# Patient Record
Sex: Male | Born: 1990 | Race: Black or African American | Hispanic: No | Marital: Married | Smoking: Current every day smoker
Health system: Southern US, Community
[De-identification: ages and names within clinical notes are randomized; demographics above are authoritative.]

## PROBLEM LIST (undated history)

## (undated) ENCOUNTER — Emergency Department (HOSPITAL_COMMUNITY): Admission: EM | Payer: Medicaid Other | Source: Home / Self Care

## (undated) HISTORY — PX: HAND SURGERY: SHX662

---
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
## 2008-07-11 ENCOUNTER — Emergency Department (HOSPITAL_COMMUNITY): Admission: EM | Admit: 2008-07-11 | Discharge: 2008-07-11 | Payer: Self-pay | Admitting: Emergency Medicine

## 2009-01-28 ENCOUNTER — Emergency Department (HOSPITAL_COMMUNITY): Admission: EM | Admit: 2009-01-28 | Discharge: 2009-01-28 | Payer: Self-pay | Admitting: Emergency Medicine

## 2009-06-04 ENCOUNTER — Emergency Department (HOSPITAL_COMMUNITY): Admission: EM | Admit: 2009-06-04 | Discharge: 2009-06-05 | Payer: Self-pay | Admitting: Emergency Medicine

## 2009-06-08 ENCOUNTER — Ambulatory Visit (HOSPITAL_COMMUNITY): Admission: RE | Admit: 2009-06-08 | Discharge: 2009-06-08 | Payer: Self-pay | Admitting: Orthopedic Surgery

## 2009-06-17 ENCOUNTER — Encounter: Admission: RE | Admit: 2009-06-17 | Discharge: 2009-07-29 | Payer: Self-pay | Admitting: Orthopedic Surgery

## 2009-11-26 ENCOUNTER — Emergency Department (HOSPITAL_COMMUNITY): Admission: EM | Admit: 2009-11-26 | Discharge: 2009-11-26 | Payer: Self-pay | Admitting: Emergency Medicine

## 2009-11-30 ENCOUNTER — Inpatient Hospital Stay (HOSPITAL_COMMUNITY): Admission: EM | Admit: 2009-11-30 | Discharge: 2009-12-08 | Payer: Self-pay | Admitting: Emergency Medicine

## 2009-11-30 ENCOUNTER — Ambulatory Visit: Payer: Self-pay | Admitting: Infectious Diseases

## 2009-12-01 ENCOUNTER — Encounter (INDEPENDENT_AMBULATORY_CARE_PROVIDER_SITE_OTHER): Payer: Self-pay | Admitting: Internal Medicine

## 2010-03-15 ENCOUNTER — Emergency Department (HOSPITAL_COMMUNITY)
Admission: EM | Admit: 2010-03-15 | Discharge: 2010-03-15 | Payer: Self-pay | Source: Home / Self Care | Admitting: Emergency Medicine

## 2010-06-01 LAB — CULTURE, BLOOD (ROUTINE X 2)
Culture  Setup Time: 201109170026
Culture  Setup Time: 201109182114
Culture  Setup Time: 201109141738
Culture  Setup Time: 201109141738
Culture: NO GROWTH
Culture: NO GROWTH
Culture: NO GROWTH

## 2010-06-01 LAB — URINE MICROSCOPIC-ADD ON

## 2010-06-01 LAB — BASIC METABOLIC PANEL
Chloride: 106 mEq/L (ref 96–112)
Creatinine, Ser: 1.11 mg/dL (ref 0.4–1.5)
GFR calc Af Amer: >60 mL/min (ref >60)
GFR calc non Af Amer: >60 mL/min (ref >60)
Glucose, Bld: 137 mg/dL — ABNORMAL HIGH (ref 70–99)
Potassium: 3.8 mEq/L (ref 3.5–5.1)
Potassium: 4.7 mEq/L (ref 3.5–5.1)
Sodium: 136 mEq/L (ref 135–145)
Sodium: 137 mEq/L (ref 135–145)

## 2010-06-01 LAB — CBC
HCT: 32.1 % — ABNORMAL LOW (ref 39.0–52.0)
HCT: 33.1 % — ABNORMAL LOW (ref 39.0–52.0)
HCT: 33.4 % — ABNORMAL LOW (ref 39.0–52.0)
HCT: 34.1 % — ABNORMAL LOW (ref 39.0–52.0)
HCT: 35.8 % — ABNORMAL LOW (ref 39.0–52.0)
HCT: 42.5 % (ref 39.0–52.0)
Hemoglobin: 10.9 g/dL — ABNORMAL LOW (ref 13.0–17.0)
Hemoglobin: 11.1 g/dL — ABNORMAL LOW (ref 13.0–17.0)
Hemoglobin: 11.4 g/dL — ABNORMAL LOW (ref 13.0–17.0)
Hemoglobin: 11.8 g/dL — ABNORMAL LOW (ref 13.0–17.0)
Hemoglobin: 12.5 g/dL — ABNORMAL LOW (ref 13.0–17.0)
MCH: 32.3 pg (ref 26.0–34.0)
MCHC: 34.9 g/dL (ref 30.0–36.0)
MCHC: 34.5 g/dL (ref 30.0–36.0)
MCV: 92.5 fL (ref 78.0–100.0)
MCV: 92.7 fL (ref 78.0–100.0)
MCV: 93.5 fL (ref 78.0–100.0)
MCV: 93.7 fL (ref 78.0–100.0)
MCV: 93.8 fL (ref 78.0–100.0)
Platelets: 364 10*3/uL (ref 150–400)
Platelets: 367 10*3/uL (ref 150–400)
Platelets: 455 10*3/uL — ABNORMAL HIGH (ref 150–400)
RBC: 3.44 MIL/uL — ABNORMAL LOW (ref 4.22–5.81)
RBC: 3.53 MIL/uL — ABNORMAL LOW (ref 4.22–5.81)
RBC: 3.61 MIL/uL — ABNORMAL LOW (ref 4.22–5.81)
RDW: 13.5 % (ref 11.5–15.5)
RDW: 13.3 % (ref 11.5–15.5)
WBC: 15.2 10*3/uL — ABNORMAL HIGH (ref 4.0–10.5)
WBC: 15.2 10*3/uL — ABNORMAL HIGH (ref 4.0–10.5)
WBC: 17.3 10*3/uL — ABNORMAL HIGH (ref 4.0–10.5)
WBC: 19.5 10*3/uL — ABNORMAL HIGH (ref 4.0–10.5)
WBC: 20.5 10*3/uL — ABNORMAL HIGH (ref 4.0–10.5)

## 2010-06-01 LAB — DIFFERENTIAL
Basophils Absolute: 0 10*3/uL (ref 0.0–0.1)
Basophils Absolute: 0 10*3/uL (ref 0.0–0.1)
Basophils Relative: 0 % (ref 0–1)
Basophils Relative: 0 % (ref 0–1)
Basophils Relative: 0 % (ref 0–1)
Eosinophils Absolute: 1 10*3/uL — ABNORMAL HIGH (ref 0.0–0.7)
Eosinophils Absolute: 1.4 10*3/uL — ABNORMAL HIGH (ref 0.0–0.7)
Eosinophils Absolute: 2.1 10*3/uL — ABNORMAL HIGH (ref 0.0–0.7)
Eosinophils Absolute: 1.3 10*3/uL — ABNORMAL HIGH (ref 0.0–0.7)
Eosinophils Relative: 10 % — ABNORMAL HIGH (ref 0–5)
Eosinophils Relative: 10 % — ABNORMAL HIGH (ref 0–5)
Lymphocytes Relative: 2 % — ABNORMAL LOW (ref 12–46)
Lymphocytes Relative: 4 % — ABNORMAL LOW (ref 12–46)
Lymphocytes Relative: 5 % — ABNORMAL LOW (ref 12–46)
Lymphocytes Relative: 5 % — ABNORMAL LOW (ref 12–46)
Lymphs Abs: 0.5 10*3/uL — ABNORMAL LOW (ref 0.7–4.0)
Lymphs Abs: 0.5 10*3/uL — ABNORMAL LOW (ref 0.7–4.0)
Lymphs Abs: 0.7 10*3/uL (ref 0.7–4.0)
Lymphs Abs: 0.7 10*3/uL (ref 0.7–4.0)
Monocytes Absolute: 0.2 10*3/uL (ref 0.1–1.0)
Monocytes Absolute: 0.8 10*3/uL (ref 0.1–1.0)
Monocytes Absolute: 0.9 10*3/uL (ref 0.1–1.0)
Monocytes Relative: 1 % — ABNORMAL LOW (ref 3–12)
Monocytes Relative: 3 % (ref 3–12)
Monocytes Relative: 5 % (ref 3–12)
Monocytes Relative: 6 % (ref 3–12)
Monocytes Relative: 8 % (ref 3–12)
Monocytes Relative: 8 % (ref 3–12)
Neutro Abs: 11.4 10*3/uL — ABNORMAL HIGH (ref 1.7–7.7)
Neutro Abs: 12.5 10*3/uL — ABNORMAL HIGH (ref 1.7–7.7)
Neutro Abs: 14.3 10*3/uL — ABNORMAL HIGH (ref 1.7–7.7)
Neutro Abs: 14.4 10*3/uL — ABNORMAL HIGH (ref 1.7–7.7)
Neutrophils Relative %: 82 % — ABNORMAL HIGH (ref 43–77)
Neutrophils Relative %: 83 % — ABNORMAL HIGH (ref 43–77)
Neutrophils Relative %: 78 % — ABNORMAL HIGH (ref 43–77)

## 2010-06-01 LAB — COMPREHENSIVE METABOLIC PANEL
ALT: 36 U/L (ref 0–53)
ALT: 50 U/L (ref 0–53)
ALT: 92 U/L — ABNORMAL HIGH (ref 0–53)
AST: 20 U/L (ref 0–37)
Albumin: 2.2 g/dL — ABNORMAL LOW (ref 3.5–5.2)
Albumin: 2.4 g/dL — ABNORMAL LOW (ref 3.5–5.2)
Alkaline Phosphatase: 119 U/L — ABNORMAL HIGH (ref 39–117)
Alkaline Phosphatase: 93 U/L (ref 39–117)
BUN: 4 mg/dL — ABNORMAL LOW (ref 6–23)
BUN: 4 mg/dL — ABNORMAL LOW (ref 6–23)
BUN: 5 mg/dL — ABNORMAL LOW (ref 6–23)
CO2: 24 mEq/L (ref 19–32)
CO2: 26 mEq/L (ref 19–32)
Calcium: 8.6 mg/dL (ref 8.4–10.5)
Calcium: 8.6 mg/dL (ref 8.4–10.5)
Calcium: 9.4 mg/dL (ref 8.4–10.5)
Chloride: 105 mEq/L (ref 96–112)
Chloride: 106 mEq/L (ref 96–112)
Creatinine, Ser: 1.25 mg/dL (ref 0.4–1.5)
Creatinine, Ser: 1.15 mg/dL (ref 0.4–1.5)
GFR calc Af Amer: >60 mL/min (ref >60)
GFR calc Af Amer: >60 mL/min (ref >60)
GFR calc non Af Amer: >60 mL/min (ref >60)
GFR calc non Af Amer: >60 mL/min (ref >60)
Glucose, Bld: 111 mg/dL — ABNORMAL HIGH (ref 70–99)
Glucose, Bld: 115 mg/dL — ABNORMAL HIGH (ref 70–99)
Glucose, Bld: 120 mg/dL — ABNORMAL HIGH (ref 70–99)
Glucose, Bld: 97 mg/dL (ref 70–99)
Potassium: 4 mEq/L (ref 3.5–5.1)
Potassium: 4 mEq/L (ref 3.5–5.1)
Sodium: 137 mEq/L (ref 135–145)
Sodium: 137 mEq/L (ref 135–145)
Sodium: 137 mEq/L (ref 135–145)
Total Bilirubin: 0.9 mg/dL (ref 0.3–1.2)
Total Bilirubin: 0.9 mg/dL (ref 0.3–1.2)
Total Protein: 6.1 g/dL (ref 6.0–8.3)
Total Protein: 6.1 g/dL (ref 6.0–8.3)
Total Protein: 7.8 g/dL (ref 6.0–8.3)

## 2010-06-01 LAB — HEPATITIS PANEL, ACUTE
HCV Ab: NEGATIVE
Hep A IgM: NEGATIVE
Hep B C IgM: NEGATIVE
Hepatitis B Surface Ag: NEGATIVE

## 2010-06-01 LAB — URINALYSIS, ROUTINE W REFLEX MICROSCOPIC
Bilirubin Urine: NEGATIVE
Glucose, UA: NEGATIVE mg/dL
Glucose, UA: NEGATIVE mg/dL
Hgb urine dipstick: NEGATIVE
Hgb urine dipstick: NEGATIVE
Ketones, ur: NEGATIVE mg/dL
Nitrite: NEGATIVE
Protein, ur: NEGATIVE mg/dL
Protein, ur: NEGATIVE mg/dL
Specific Gravity, Urine: 1.005 (ref 1.005–1.030)
Urobilinogen, UA: 1 mg/dL (ref 0.0–1.0)
pH: 7 (ref 5.0–8.0)
pH: 6.5 (ref 5.0–8.0)

## 2010-06-01 LAB — STREP A DNA PROBE

## 2010-06-01 LAB — URINE CULTURE
Colony Count: NO GROWTH
Culture: NO GROWTH

## 2010-06-01 LAB — CORTISOL: Cortisol, Plasma: 22.7 ug/dL

## 2010-06-01 LAB — FERRITIN: Ferritin: 698 ng/mL — ABNORMAL HIGH (ref 22–322)

## 2010-06-01 LAB — SEDIMENTATION RATE: Sed Rate: 33 mm/hr — ABNORMAL HIGH (ref 0–16)

## 2010-06-01 LAB — HIV-1 RNA ULTRAQUANT REFLEX TO GENTYP+
HIV 1 RNA Quant: <20 copies/mL (ref <20)
HIV-1 RNA Quant, Log: <1.3 {Log} (ref <1.30)

## 2010-06-01 LAB — EPSTEIN-BARR VIRUS VCA ANTIBODY PANEL
EBV EA IgG: 0.43 {ISR}
EBV NA IgG: 3.22 {ISR} — ABNORMAL HIGH
EBV VCA IgG: 4.13 {ISR} — ABNORMAL HIGH

## 2010-06-01 LAB — RHEUMATOID FACTOR: Rhuematoid fact SerPl-aCnc: 28 IU/mL — ABNORMAL HIGH (ref 0–20)

## 2010-06-01 LAB — TSH
TSH: 0.939 u[IU]/mL (ref 0.350–4.500)
TSH: 0.841 u[IU]/mL (ref 0.350–4.500)

## 2010-06-01 LAB — PROCALCITONIN: Procalcitonin: 0.74 ng/mL

## 2010-06-01 LAB — IGG: IgG (Immunoglobin G), Serum: 1230 mg/dL (ref 694–1618)

## 2010-06-01 LAB — C-REACTIVE PROTEIN: CRP: 15 mg/dL — ABNORMAL HIGH (ref <0.6)

## 2010-06-01 LAB — PHOSPHORUS: Phosphorus: 2.2 mg/dL — ABNORMAL LOW (ref 2.3–4.6)

## 2010-06-01 LAB — HEMOCCULT GUIAC POC 1CARD (OFFICE): Fecal Occult Bld: NEGATIVE

## 2010-06-01 LAB — QUANTIFERON TB GOLD ASSAY (BLOOD): TB Antigen Minus Nil Value: 0 IU/mL

## 2010-06-01 LAB — GC/CHLAMYDIA PROBE AMP, URINE: GC Probe Amp, Urine: NEGATIVE

## 2010-06-01 LAB — DIRECT ANTIGLOBULIN TEST (NOT AT ARMC): DAT, IgG: NEGATIVE

## 2010-06-01 LAB — ANA: Anti Nuclear Antibody(ANA): NEGATIVE

## 2010-06-01 LAB — ROCKY MTN SPOTTED FVR AB, IGG-BLOOD: RMSF IgG: 0.06 IV

## 2010-06-01 LAB — MAGNESIUM: Magnesium: 1.6 mg/dL (ref 1.5–2.5)

## 2010-06-01 LAB — ANTI-NEUTROPHIL ANTIBODY

## 2010-06-11 LAB — CBC
HCT: 46.8 % (ref 39.0–52.0)
Hemoglobin: 16 g/dL (ref 13.0–17.0)
MCHC: 34.2 g/dL (ref 30.0–36.0)
MCV: 95 fL (ref 78.0–100.0)
Platelets: 212 10*3/uL (ref 150–400)
RBC: 4.93 MIL/uL (ref 4.22–5.81)
RDW: 12.8 % (ref 11.5–15.5)
WBC: 4.8 10*3/uL (ref 4.0–10.5)

## 2010-06-21 LAB — BASIC METABOLIC PANEL
BUN: 10 mg/dL (ref 6–23)
CO2: 26 mEq/L (ref 19–32)
Chloride: 105 mEq/L (ref 96–112)
Creatinine, Ser: 1.36 mg/dL (ref 0.4–1.5)
Glucose, Bld: 89 mg/dL (ref 70–99)
Potassium: 3.9 mEq/L (ref 3.5–5.1)

## 2010-06-21 LAB — DIFFERENTIAL
Basophils Relative: 1 % (ref 0–1)
Eosinophils Absolute: 0.2 10*3/uL (ref 0.0–0.7)
Eosinophils Relative: 4 % (ref 0–5)
Lymphs Abs: 1.5 10*3/uL (ref 0.7–4.0)
Neutrophils Relative %: 66 % (ref 43–77)

## 2010-06-21 LAB — CBC
HCT: 53.8 % — ABNORMAL HIGH (ref 39.0–52.0)
MCHC: 33.7 g/dL (ref 30.0–36.0)
MCV: 94.9 fL (ref 78.0–100.0)
Platelets: 249 10*3/uL (ref 150–400)

## 2010-12-18 LAB — DIFFERENTIAL
Basophils Absolute: 0
Basophils Relative: 0
Eosinophils Absolute: 0.1
Eosinophils Relative: 1
Lymphocytes Relative: 3 — ABNORMAL LOW
Lymphs Abs: 0.3 — ABNORMAL LOW
Monocytes Absolute: 0.5
Monocytes Relative: 5
Neutro Abs: 10 — ABNORMAL HIGH
Neutrophils Relative %: 92 — ABNORMAL HIGH

## 2010-12-18 LAB — COMPREHENSIVE METABOLIC PANEL WITH GFR
ALT: 16
AST: 23
Albumin: 4.4
Alkaline Phosphatase: 72
BUN: 15
CO2: 25
Calcium: 10.1
Chloride: 106
Creatinine, Ser: 1.26
GFR calc non Af Amer: >60
Glucose, Bld: 113 — ABNORMAL HIGH
Potassium: 4.2
Sodium: 139
Total Bilirubin: 1.6 — ABNORMAL HIGH
Total Protein: 7.7

## 2010-12-18 LAB — CBC
HCT: 49.2
Hemoglobin: 16.5
MCHC: 33.5
MCV: 93.4
Platelets: 244
RBC: 5.27
RDW: 12.8
WBC: 10.9 — ABNORMAL HIGH

## 2010-12-18 LAB — RAPID STREP SCREEN (MED CTR MEBANE ONLY): Streptococcus, Group A Screen (Direct): NEGATIVE

## 2011-04-20 ENCOUNTER — Emergency Department (HOSPITAL_COMMUNITY): Payer: No Typology Code available for payment source

## 2011-04-20 ENCOUNTER — Encounter (HOSPITAL_COMMUNITY): Payer: Self-pay

## 2011-04-20 ENCOUNTER — Emergency Department (HOSPITAL_COMMUNITY)
Admission: EM | Admit: 2011-04-20 | Discharge: 2011-04-20 | Disposition: A | Discharge status: Home / Self Care | Payer: No Typology Code available for payment source | Attending: Emergency Medicine | Admitting: Emergency Medicine

## 2011-04-20 DIAGNOSIS — S139XXA Sprain of joints and ligaments of unspecified parts of neck, initial encounter: Secondary | ICD-10-CM | POA: Insufficient documentation

## 2011-04-20 DIAGNOSIS — M542 Cervicalgia: Secondary | ICD-10-CM | POA: Insufficient documentation

## 2011-04-20 DIAGNOSIS — S161XXA Strain of muscle, fascia and tendon at neck level, initial encounter: Secondary | ICD-10-CM

## 2011-04-20 DIAGNOSIS — M546 Pain in thoracic spine: Secondary | ICD-10-CM | POA: Insufficient documentation

## 2011-04-20 DIAGNOSIS — F17200 Nicotine dependence, unspecified, uncomplicated: Secondary | ICD-10-CM | POA: Insufficient documentation

## 2011-04-20 DIAGNOSIS — J45909 Unspecified asthma, uncomplicated: Secondary | ICD-10-CM | POA: Insufficient documentation

## 2011-04-20 DIAGNOSIS — R51 Headache: Secondary | ICD-10-CM | POA: Insufficient documentation

## 2011-04-20 DIAGNOSIS — S0990XA Unspecified injury of head, initial encounter: Secondary | ICD-10-CM | POA: Insufficient documentation

## 2011-04-20 MED ORDER — HYDROCODONE-ACETAMINOPHEN 5-500 MG PO TABS: 1.0000 | ORAL_TABLET | Freq: Four times a day (QID) | ORAL | Status: AC | PRN

## 2011-04-20 MED ORDER — IBUPROFEN 600 MG PO TABS: 600.0000 mg | ORAL_TABLET | Freq: Four times a day (QID) | ORAL | Status: AC | PRN

## 2011-04-20 MED ORDER — DIAZEPAM 5 MG PO TABS: 5.0000 mg | ORAL_TABLET | Freq: Three times a day (TID) | ORAL | Status: AC | PRN

## 2011-04-20 MED ORDER — KETOROLAC TROMETHAMINE 60 MG/2ML IM SOLN
60.0000 mg | Freq: Once | INTRAMUSCULAR | Status: AC
  Administered 2011-04-20: 60 mg via INTRAMUSCULAR
  Filled 2011-04-20: qty 2
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
## 2011-04-20 NOTE — ED Notes (Signed)
Patient transported to X-ray 

## 2011-04-20 NOTE — ED Notes (Signed)
Pt amb down hall and back without any problem.  Drinking PO fluids without problem

## 2011-04-20 NOTE — ED Notes (Signed)
Backboard removed by PA

## 2011-11-03 ENCOUNTER — Emergency Department (HOSPITAL_COMMUNITY): Payer: Self-pay

## 2011-11-03 ENCOUNTER — Encounter (HOSPITAL_COMMUNITY): Payer: Self-pay | Admitting: *Deleted

## 2011-11-03 ENCOUNTER — Emergency Department (HOSPITAL_COMMUNITY)
Admission: EM | Admit: 2011-11-03 | Discharge: 2011-11-03 | Disposition: A | Discharge status: Home / Self Care | Payer: Self-pay | Attending: Emergency Medicine | Admitting: Emergency Medicine

## 2011-11-03 DIAGNOSIS — F17200 Nicotine dependence, unspecified, uncomplicated: Secondary | ICD-10-CM | POA: Insufficient documentation

## 2011-11-03 DIAGNOSIS — R109 Unspecified abdominal pain: Secondary | ICD-10-CM | POA: Insufficient documentation

## 2011-11-03 DIAGNOSIS — S301XXA Contusion of abdominal wall, initial encounter: Secondary | ICD-10-CM

## 2011-11-03 MED ORDER — TRAMADOL HCL 50 MG PO TABS: 50.0000 mg | ORAL_TABLET | Freq: Four times a day (QID) | ORAL | Status: AC | PRN

## 2011-11-03 NOTE — ED Provider Notes (Addendum)
History  This chart was scribed for Benny Lennert, MD by Ladona Ridgel Day. This patient was seen in room TR06C/TR06C and the patient's care was started at 1534.   CSN: [id number]  Arrival date & time 11/03/11  1534   First MD Initiated Contact with Patient 11/03/11 1610      Chief Complaint  Patient presents with  . Pain   Patient is a 21 y.o. male presenting with abdominal pain. The history is provided by the patient. No language interpreter was used.  Abdominal Pain The primary symptoms of the illness include abdominal pain. The primary symptoms of the illness do not include fatigue or diarrhea. The current episode started 6 to 12 hours ago. The onset of the illness was sudden. The problem has been gradually worsening.  Associated with: movement. Symptoms associated with the illness do not include hematuria, frequency or back pain.   Connor Brennan is a 21 y.o. male who presents to the Emergency Department complaining of right rib pain since this AM and states was playing football yesterday. He states that it is painful to take in deep breaths and that he also woke up this AM with a cough. He denies any other injuries/illnesses at this time.  Past Medical History  Diagnosis Date  . Asthma     History reviewed. No pertinent past surgical history.  History reviewed. No pertinent family history.  History  Substance Use Topics  . Smoking status: Current Everyday Smoker -- 0.5 packs/day  . Smokeless tobacco: Not on file  . Alcohol Use: Yes      Review of Systems  Constitutional: Negative for fatigue.  HENT: Negative for congestion, sinus pressure and ear discharge.   Eyes: Negative for discharge.  Respiratory: Positive for cough.        His right chest is sore.   Gastrointestinal: Positive for abdominal pain. Negative for diarrhea.  Genitourinary: Negative for frequency and hematuria.  Musculoskeletal: Negative for back pain.  Skin: Negative for rash.  Neurological: Negative  for seizures.  Hematological: Negative.   Psychiatric/Behavioral: Negative for hallucinations.  All other systems reviewed and are negative.    Allergies  Eggs or egg-derived products and Shellfish allergy  Home Medications   Current Outpatient Rx  Name Route Sig Dispense Refill  . IBUPROFEN 200 MG PO TABS Oral Take 200 mg by mouth every 6 (six) hours as needed. For pain      Triage Vitals: BP 123/67  Pulse 87  Temp 98.5 F (36.9 C) (Oral)  Resp 20  SpO2 98%  Physical Exam  Nursing note and vitals reviewed. Constitutional: He is oriented to person, place, and time. He appears well-developed.  HENT:  Head: Normocephalic.  Eyes: Conjunctivae are normal.  Neck: No tracheal deviation present.  Cardiovascular:  No murmur heard. Abdominal: Soft. There is tenderness (RUQ tender. ).  Musculoskeletal: Normal range of motion.       Right lower rib tenderness.   Neurological: He is oriented to person, place, and time.  Skin: Skin is warm.  Psychiatric: He has a normal mood and affect.    ED Course  Procedures (including critical care time) DIAGNOSTIC STUDIES: Oxygen Saturation is 98% on room air, normal by my interpretation.    COORDINATION OF CARE: At 415 PM Discussed treatment plan with patient which includes right rib X-ray. Patient agrees.   Labs Reviewed - No data to display No results found.   No diagnosis found.    MDM  The chart was scribed for  me under my direct supervision.  I personally performed the history, physical, and medical decision making and all procedures in the evaluation of this patient.Marland Kitchen }         Benny Lennert, MD 11/03/11 1708  Benny Lennert, MD 11/04/11 [phone]

## 2011-11-03 NOTE — ED Notes (Signed)
Reports playing football recently, then woke up this am with right rib pain and cough. No acute distress noted at triage.

## 2011-12-04 IMAGING — CR DG CHEST 2V
2 series · 2 of 2 positions shown · non-contrast
Comparison: 01/28/2009

CLINICAL DATA: Chest pain and fever.

CHEST - 2 VIEW

[w chest pa]
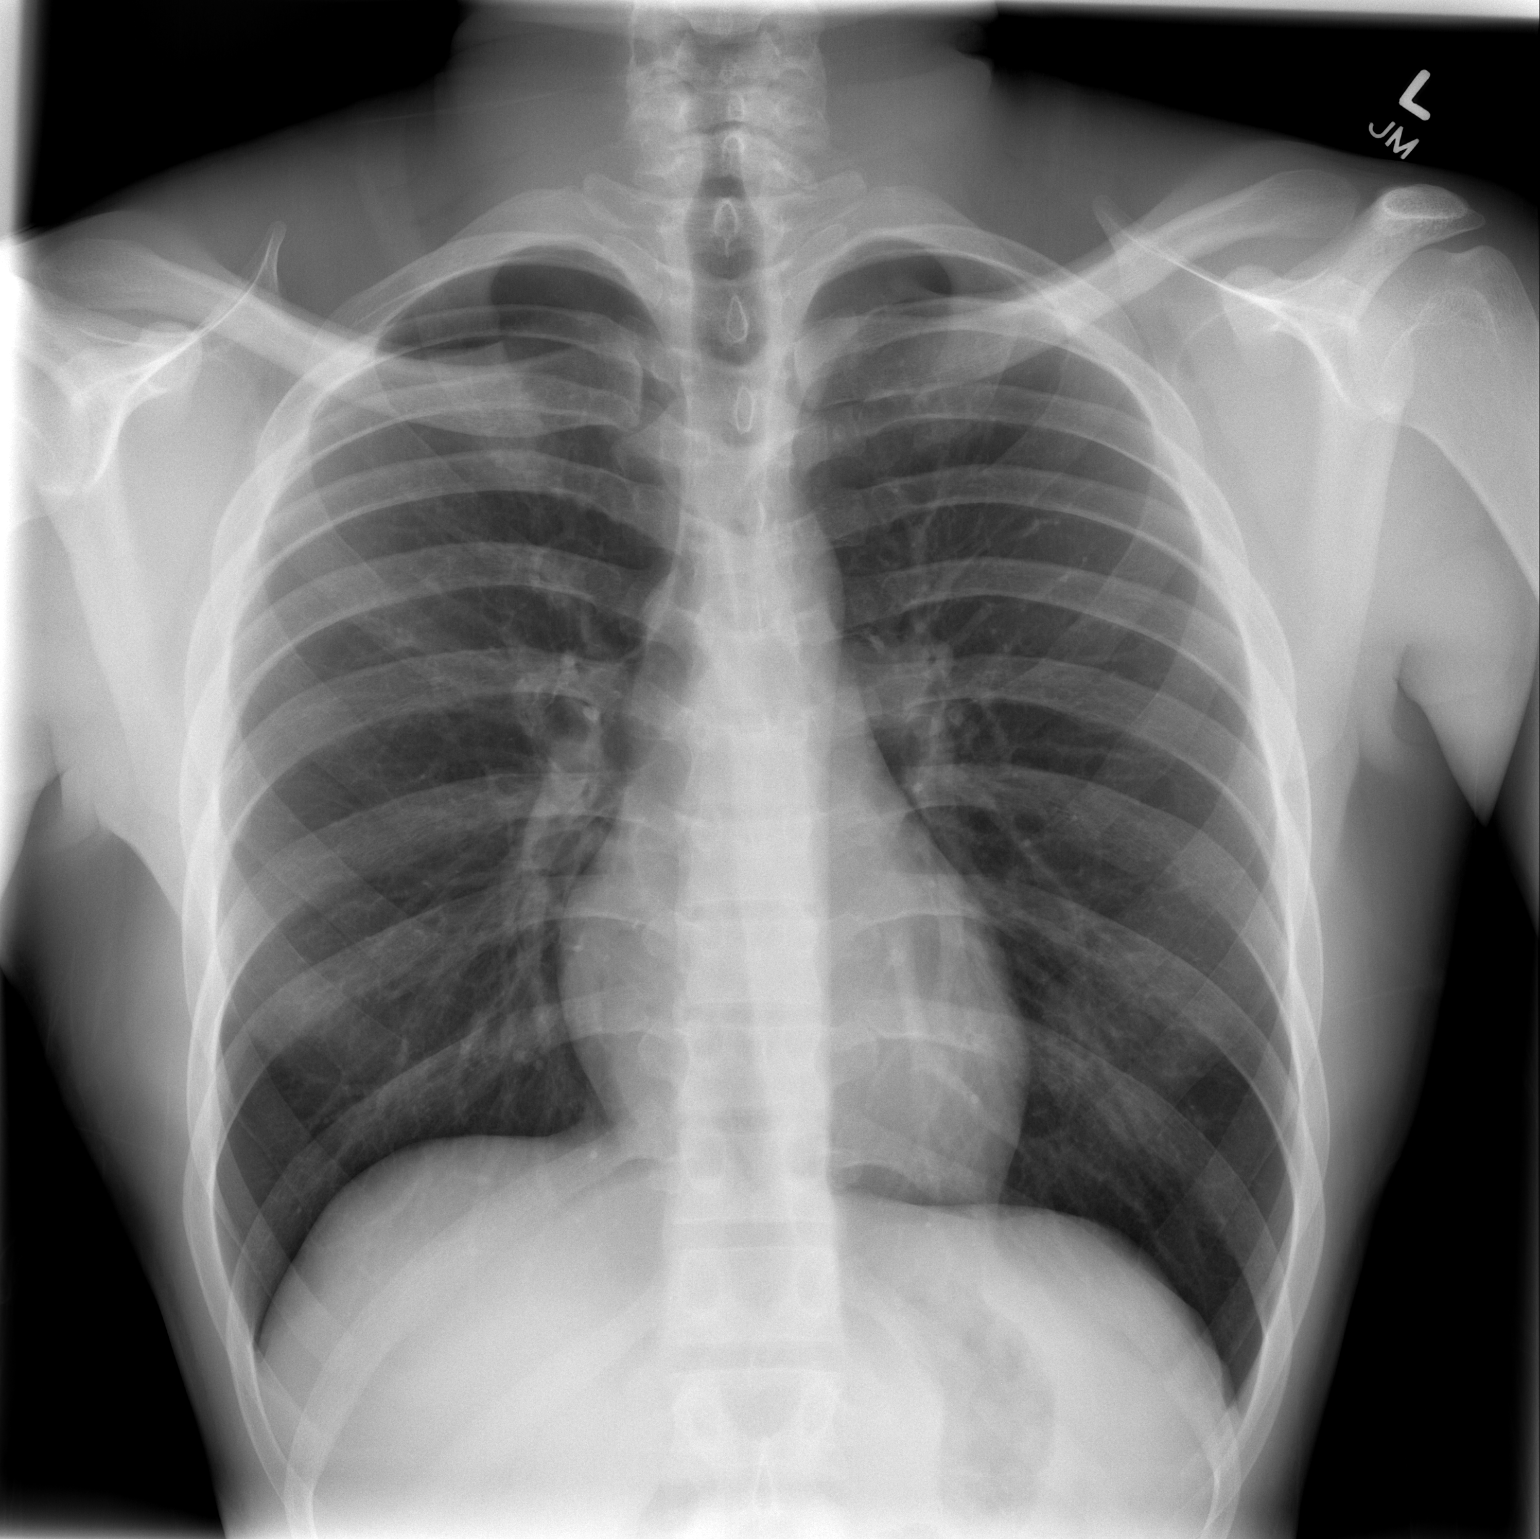

[w chest lat]
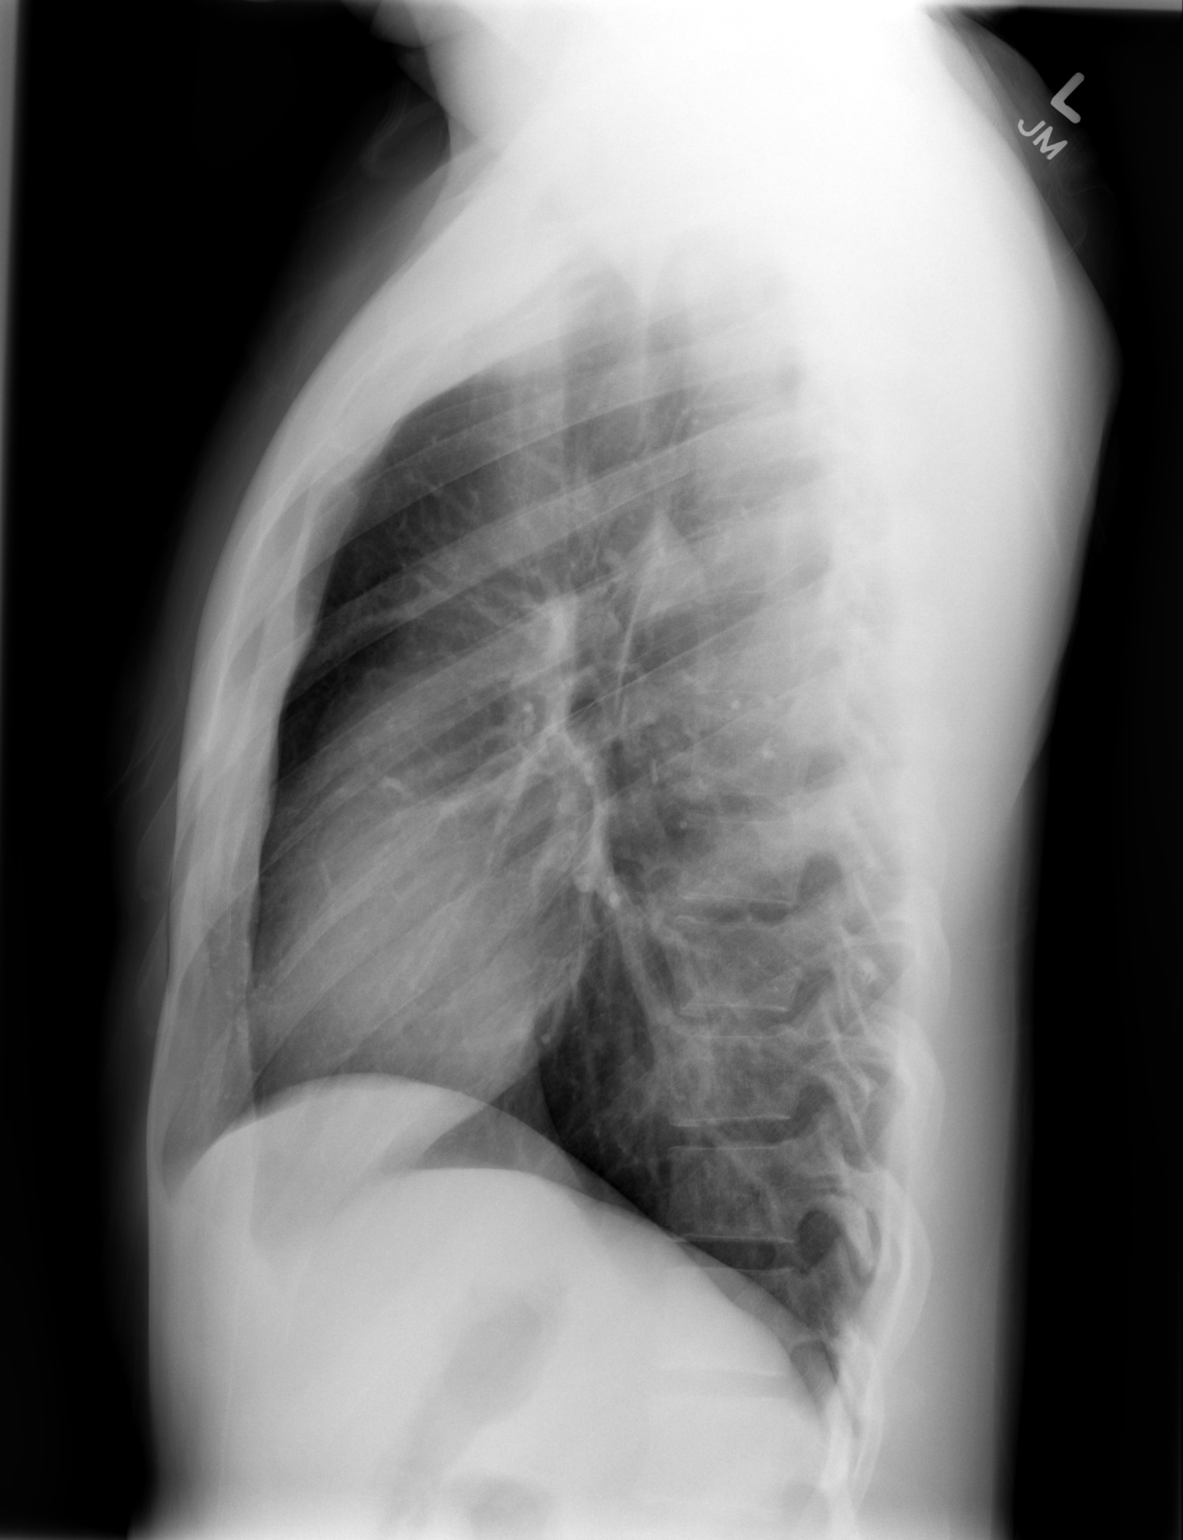

[2 of 2 positions shown; findings below may reference images not displayed]

FINDINGS: The heart size and mediastinal contours are within
normal limits.  Both lungs are clear.  The visualized skeletal
structures are unremarkable.
IMPRESSION: No active cardiopulmonary disease.

## 2011-12-08 IMAGING — CR DG CHEST 1V PORT
1 series · 1 of 1 positions shown · non-contrast
Comparison: 11/26/2009

CLINICAL DATA: Malaise

PORTABLE CHEST - 1 VIEW

[series [date]]
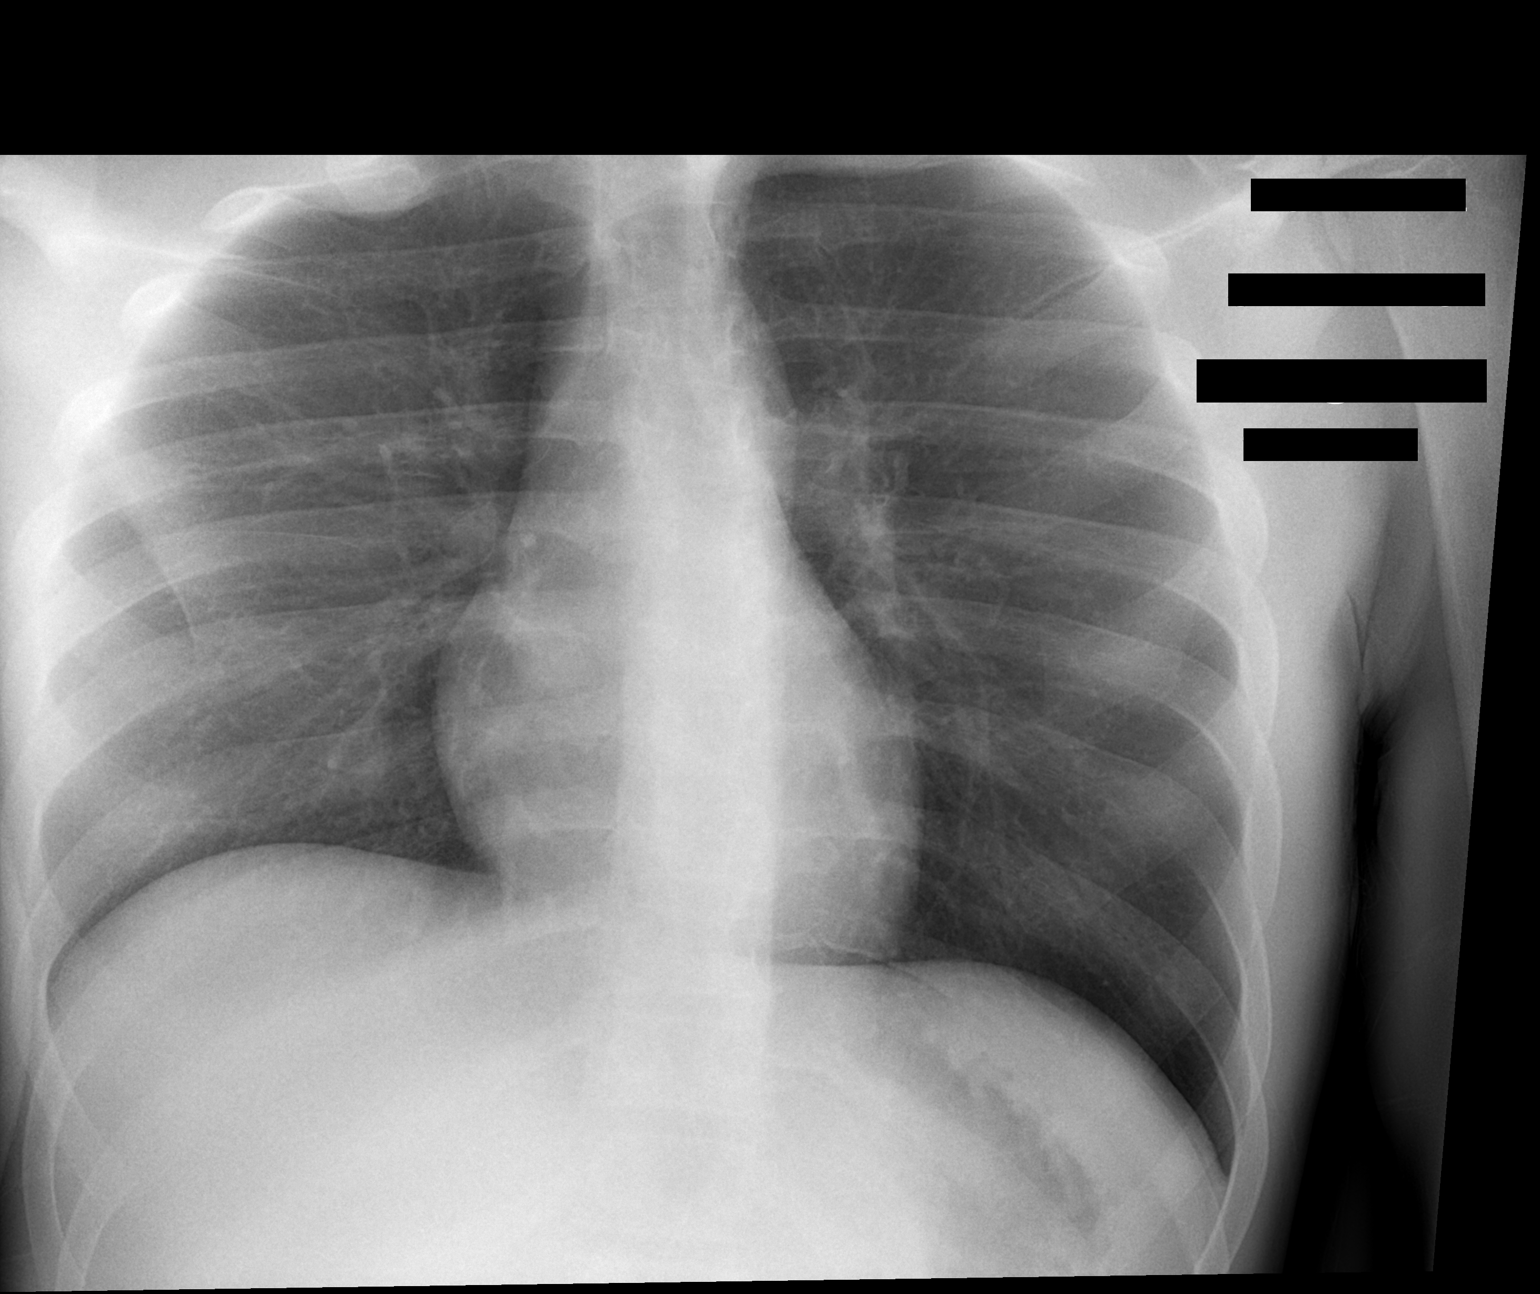

[1 of 1 positions shown; findings below may reference images not displayed]

FINDINGS: The lung volumes are decreased compared most recent exam
with elevation of the right hemidiaphragm.  No confluent airspace
opacities, edema or effusions are seen.  The heart is normal in
size.  The upper abdomen and osseous structures are unchanged.
IMPRESSION: Low lung volumes without acute findings.

## 2011-12-10 IMAGING — CT CT CHEST W/ CM
2 of 4 series · 15 of 36 positions shown, 18 images · IV contrast (omniscan)
Comparison: No similar prior study is available for comparison.

CT CHEST

CLINICAL DATA: Fever of unknown origin, rash

CT CHEST, ABDOMEN AND PELVIS WITH CONTRAST
TECHNIQUE: Multidetector CT imaging of the chest, abdomen and
pelvis was performed following the standard protocol during bolus
administration of intravenous contrast.
Contrast: 100 ml Omniscan 300 IV contrast

[Series 2: cap with st · axial · 0.68mm/px · z∈[-604,-8]mm · 12 of 135 slices shown, 15 images]
[im 8/135  mediastinal]
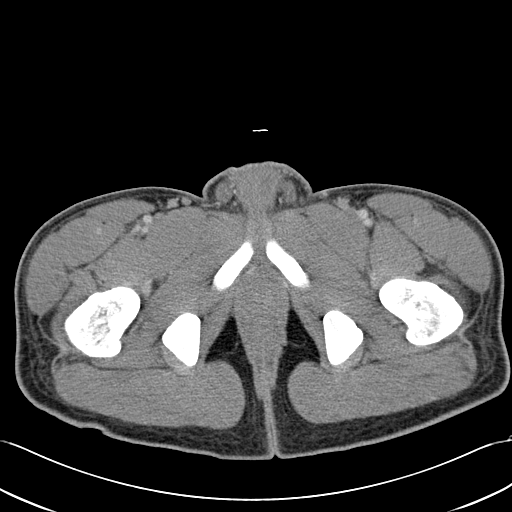
[im 8/135  lung]
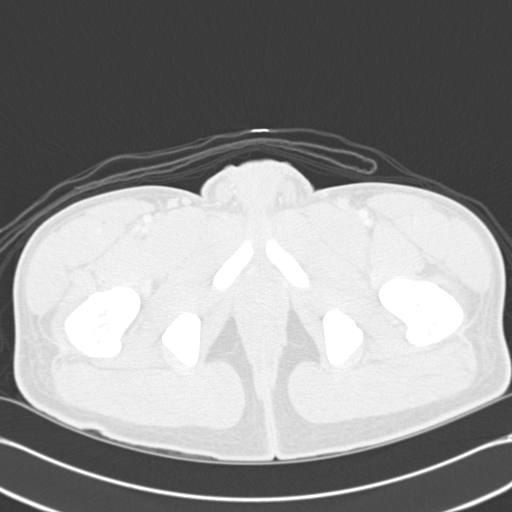
[im 23/135  lung]
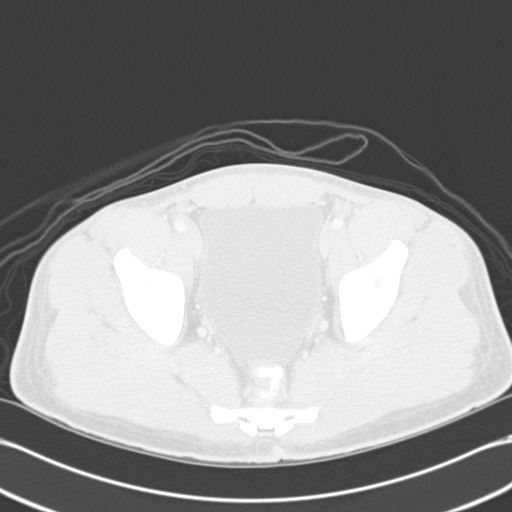
[im 30/135  lung]
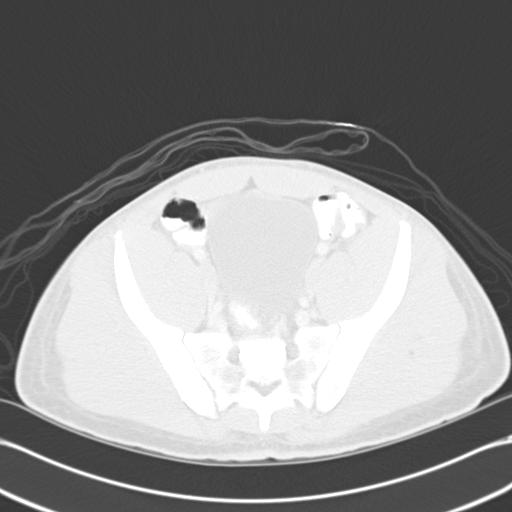
[im 38/135  lung]
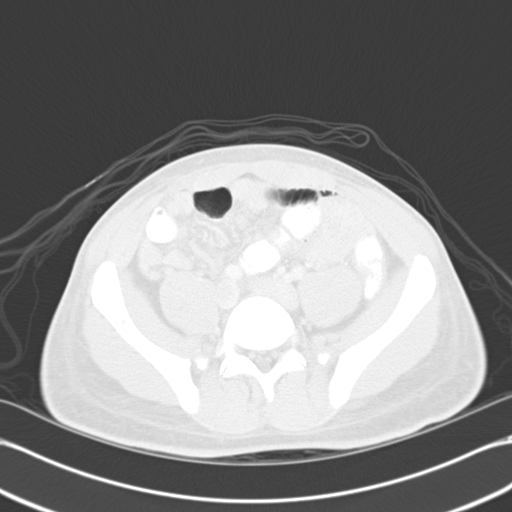
[im 53/135  mediastinal]
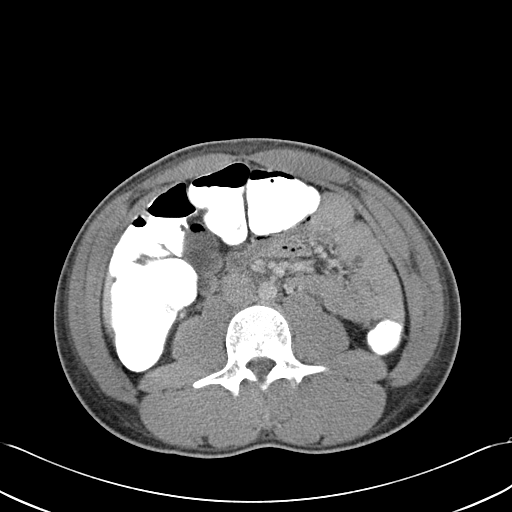
[im 53/135  lung]
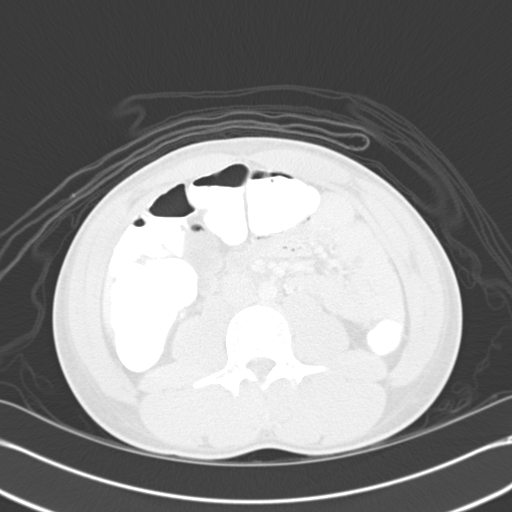
[im 60/135  lung]
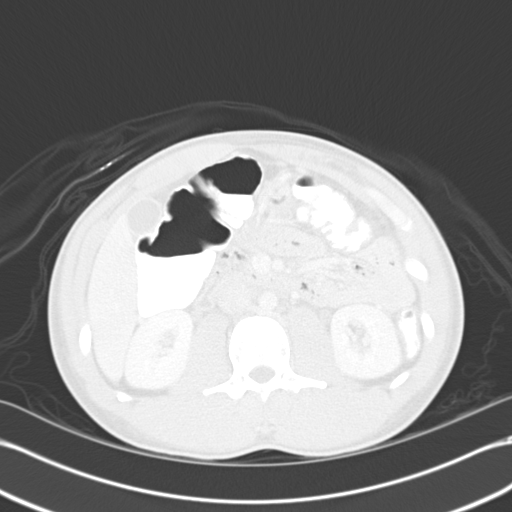
[im 75/135  lung]
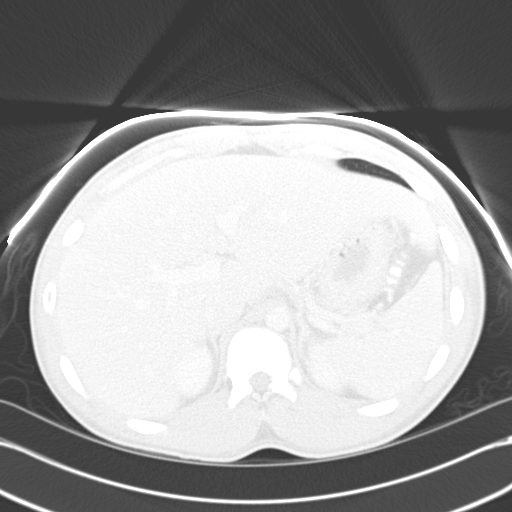
[im 82/135  lung]
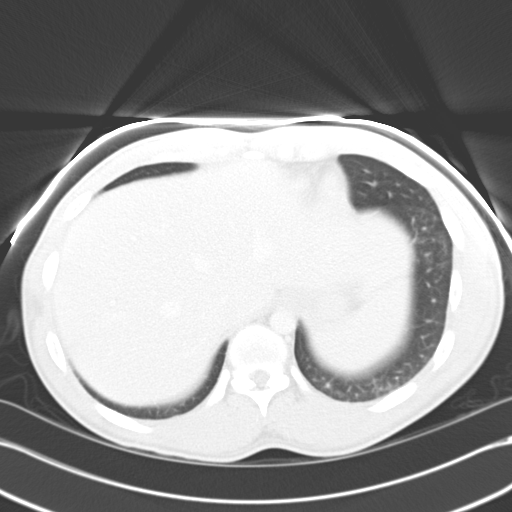
[im 97/135  mediastinal]
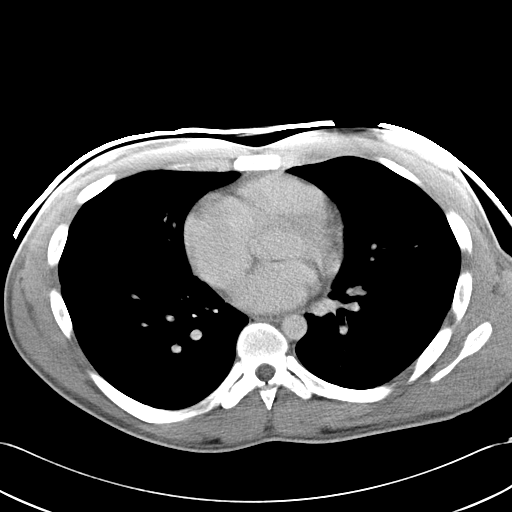
[im 97/135  lung]
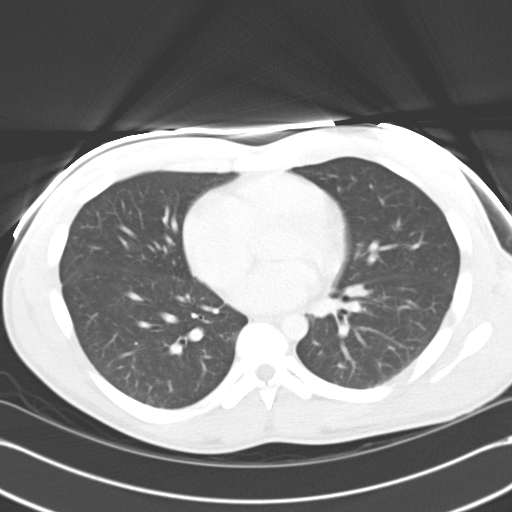
[im 105/135  lung]
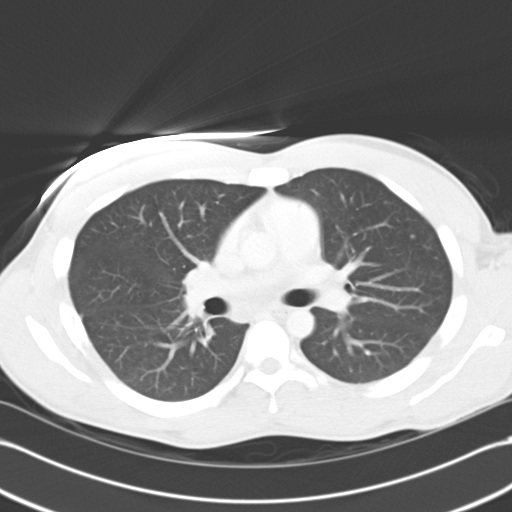
[im 112/135  lung]
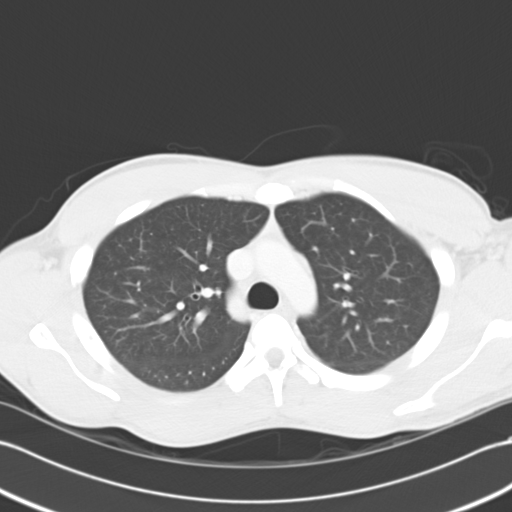
[im 127/135  lung]
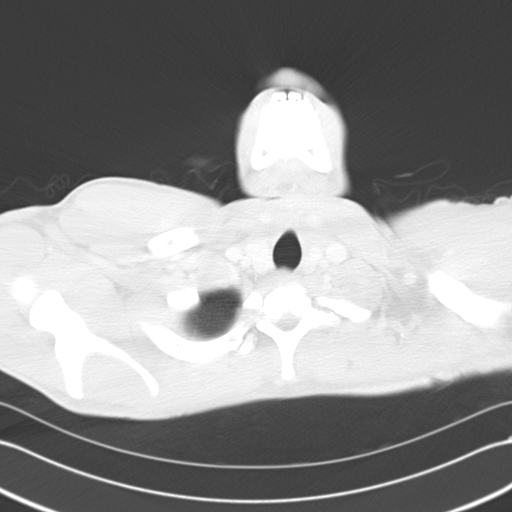

[Series 602: cor · coronal · 1.35mm/px · 3 of 67 slices shown]
[im 14/67  lung]
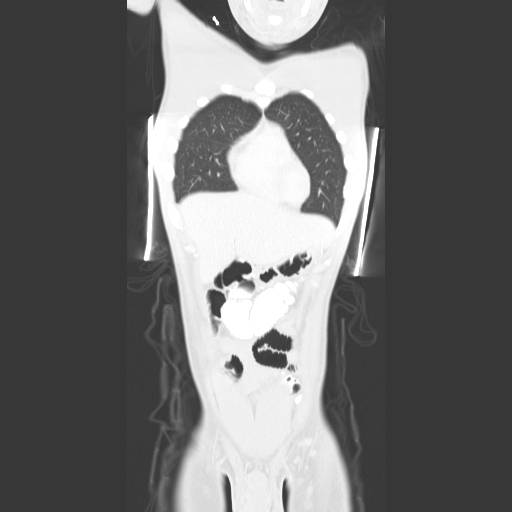
[im 27/67  lung]
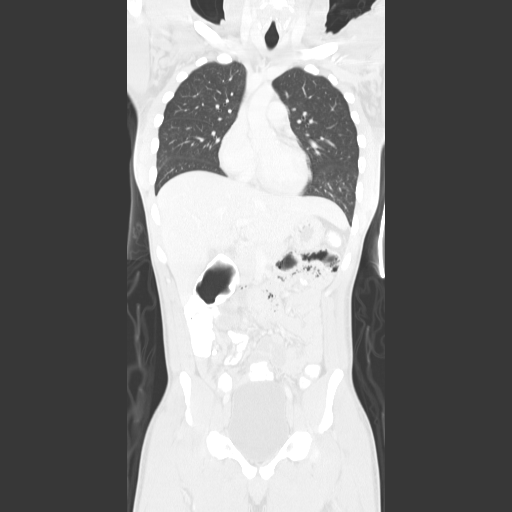
[im 40/67  lung]
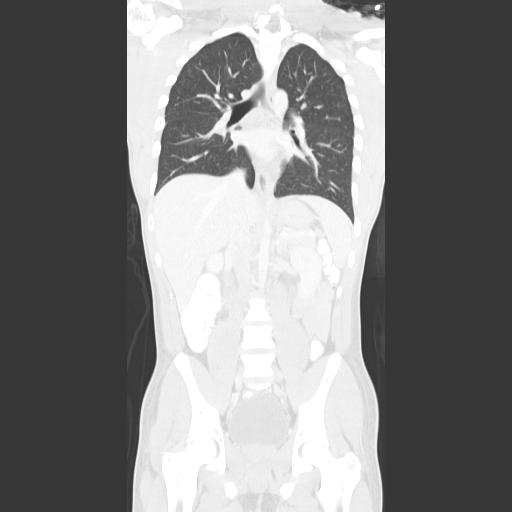

[15 of 36 positions shown; findings below may reference images not displayed]

FINDINGS: Minimal residual soft tissue density in the anterior
mediastinum is compatible with residual thymus in this young
patient.  Heart size is normal.  No pericardial or pleural
effusion.  No lymphadenopathy.  Minimal dependent left basilar
atelectasis noted.  The lungs are otherwise clear.  Central airways
are patent.
IMPRESSION: No acute intrathoracic abnormality.

CT ABDOMEN AND PELVIS
FINDINGS: Abdominal viscera are unremarkable.  No lymphadenopathy
or ascites.

The bowel is normal in appearance.  Bladder is mildly distended but
normal.  No pelvic free fluid or lymphadenopathy.  No acute osseous
abnormality.
IMPRESSION: Normal exam.

## 2012-04-04 ENCOUNTER — Emergency Department (HOSPITAL_COMMUNITY): Payer: Self-pay

## 2012-04-04 ENCOUNTER — Emergency Department (HOSPITAL_COMMUNITY)
Admission: EM | Admit: 2012-04-04 | Discharge: 2012-04-04 | Disposition: A | Discharge status: Home / Self Care | Payer: Self-pay | Attending: Emergency Medicine | Admitting: Emergency Medicine

## 2012-04-04 ENCOUNTER — Encounter (HOSPITAL_COMMUNITY): Payer: Self-pay | Admitting: *Deleted

## 2012-04-04 DIAGNOSIS — R51 Headache: Secondary | ICD-10-CM | POA: Insufficient documentation

## 2012-04-04 DIAGNOSIS — R509 Fever, unspecified: Secondary | ICD-10-CM | POA: Insufficient documentation

## 2012-04-04 DIAGNOSIS — F17200 Nicotine dependence, unspecified, uncomplicated: Secondary | ICD-10-CM | POA: Insufficient documentation

## 2012-04-04 DIAGNOSIS — R110 Nausea: Secondary | ICD-10-CM | POA: Insufficient documentation

## 2012-04-04 DIAGNOSIS — J189 Pneumonia, unspecified organism: Secondary | ICD-10-CM | POA: Insufficient documentation

## 2012-04-04 DIAGNOSIS — IMO0001 Reserved for inherently not codable concepts without codable children: Secondary | ICD-10-CM | POA: Insufficient documentation

## 2012-04-04 DIAGNOSIS — J45909 Unspecified asthma, uncomplicated: Secondary | ICD-10-CM | POA: Insufficient documentation

## 2012-04-04 DIAGNOSIS — J029 Acute pharyngitis, unspecified: Secondary | ICD-10-CM | POA: Insufficient documentation

## 2012-04-04 MED ORDER — OSELTAMIVIR PHOSPHATE 75 MG PO CAPS: 75.0000 mg | ORAL_CAPSULE | Freq: Two times a day (BID) | ORAL | Status: DC

## 2012-04-04 MED ORDER — LEVOFLOXACIN 500 MG PO TABS
500.0000 mg | ORAL_TABLET | Freq: Once | ORAL | Status: AC
  Administered 2012-04-04: 500 mg via ORAL
  Filled 2012-04-04: qty 1

## 2012-04-04 MED ORDER — LEVOFLOXACIN 500 MG PO TABS: 500.0000 mg | ORAL_TABLET | Freq: Every day | ORAL | Status: DC

## 2012-04-04 MED ORDER — SODIUM CHLORIDE 0.9 % IV SOLN
1000.0000 mL | Freq: Once | INTRAVENOUS | Status: AC
  Administered 2012-04-04: 1000 mL via INTRAVENOUS

## 2012-04-04 MED ORDER — IBUPROFEN 400 MG PO TABS
600.0000 mg | ORAL_TABLET | Freq: Once | ORAL | Status: AC
  Administered 2012-04-04: 600 mg via ORAL
  Filled 2012-04-04: qty 2

## 2012-04-04 MED ORDER — ACETAMINOPHEN 325 MG PO TABS
650.0000 mg | ORAL_TABLET | Freq: Once | ORAL | Status: AC
  Administered 2012-04-04: 650 mg via ORAL
  Filled 2012-04-04: qty 2

## 2012-04-04 MED ORDER — SODIUM CHLORIDE 0.9 % IV SOLN
1000.0000 mL | INTRAVENOUS | Status: DC
  Administered 2012-04-04: 1000 mL via INTRAVENOUS

## 2012-04-04 NOTE — ED Notes (Signed)
Family at bedside. 

## 2012-04-04 NOTE — ED Notes (Signed)
Patient transported to X-ray 

## 2012-04-04 NOTE — ED Provider Notes (Signed)
History     CSN: [id number]  Arrival date & time 04/04/12  0904   First MD Initiated Contact with Patient 04/04/12 [phone]      Chief Complaint  Patient presents with  . Influenza     The history is provided by the patient.   the patient reports cough sore throat headache and myalgias as well as chills and fever for the past several days.  He reports decreased oral intake.  No diarrhea.  Some nausea without vomiting.  His symptoms are mild to moderate in severity.  Nothing worsens or improves his symptoms.  No recent sick contacts.  Constant productive.  No shortness of breath at this time  Past Medical History  Diagnosis Date  . Asthma     History reviewed. No pertinent past surgical history.  History reviewed. No pertinent family history.  History  Substance Use Topics  . Smoking status: Current Every Day Smoker -- 0.5 packs/day  . Smokeless tobacco: Not on file  . Alcohol Use: Yes      Review of Systems  All other systems reviewed and are negative.    Allergies  Eggs or egg-derived products and Shellfish allergy  Home Medications  No current outpatient prescriptions on file.  BP 121/65  Pulse 116  Temp 99 F (37.2 C) (Oral)  Resp 20  SpO2 100%  Physical Exam  Nursing note and vitals reviewed. Constitutional: He is oriented to person, place, and time. He appears well-developed and well-nourished.  HENT:  Head: Normocephalic and atraumatic.  Eyes: EOM are normal.  Neck: Normal range of motion.  Cardiovascular: Regular rhythm, normal heart sounds and intact distal pulses.        tachycardia  Pulmonary/Chest: Effort normal and breath sounds normal. No respiratory distress.  Abdominal: Soft. He exhibits no distension. There is no tenderness.  Musculoskeletal: Normal range of motion.  Neurological: He is alert and oriented to person, place, and time.  Skin: Skin is warm and dry.  Psychiatric: He has a normal mood and affect. Judgment normal.    ED  Course  Procedures (including critical care time)  Labs Reviewed - No data to display Dg Chest 2 View  04/04/2012  *RADIOLOGY REPORT*  Clinical Data: Fever and chills  CHEST - 2 VIEW  Comparison: 12/04/2009  Findings: Normal heart size.  No effusions or edema.  Subtle asymmetric airspace opacity in the left lower lobe is identified and is concerning for early pneumonia.  IMPRESSION: Left base opacity may represent early pneumonia.   Original Report Authenticated By: Signa Kell, M.D.    I personally reviewed the imaging tests through PACS system I reviewed available ER/hospitalization records through the EMR   1. CAP (community acquired pneumonia)   2. Influenza-like illness       MDM  11:53 AM The patient is feeling somewhat better at this time.  He has questionable early pneumonia in his left lower lobe.  Vital signs are improving.  O2 sats are 100% on room air.  No increased work of breathing.  Discharge home with Levaquin and Tamiflu as some of his illness seems to be influenza-like illness.  The patient and family been informed to return the emergency department immediately for any new or worsening symptoms        Lyanne Co, MD 04/04/12 [phone]

## 2012-04-04 NOTE — ED Notes (Signed)
Pt reports flu like symptoms x 2-3 days, having cough, sore throat, headache, bodyaches, chills/fevers.

## 2014-01-08 ENCOUNTER — Emergency Department (HOSPITAL_COMMUNITY)
Admission: EM | Admit: 2014-01-08 | Discharge: 2014-01-08 | Disposition: A | Discharge status: Home / Self Care | Payer: No Typology Code available for payment source | Attending: Emergency Medicine | Admitting: Emergency Medicine

## 2014-01-08 ENCOUNTER — Encounter (HOSPITAL_COMMUNITY): Payer: Self-pay | Admitting: Emergency Medicine

## 2014-01-08 DIAGNOSIS — J45909 Unspecified asthma, uncomplicated: Secondary | ICD-10-CM | POA: Insufficient documentation

## 2014-01-08 DIAGNOSIS — Z720 Tobacco use: Secondary | ICD-10-CM | POA: Insufficient documentation

## 2014-01-08 DIAGNOSIS — G479 Sleep disorder, unspecified: Secondary | ICD-10-CM | POA: Insufficient documentation

## 2014-01-08 DIAGNOSIS — K088 Other specified disorders of teeth and supporting structures: Secondary | ICD-10-CM | POA: Insufficient documentation

## 2014-01-08 DIAGNOSIS — K0889 Other specified disorders of teeth and supporting structures: Secondary | ICD-10-CM

## 2014-01-08 MED ORDER — PENICILLIN V POTASSIUM 250 MG PO TABS: 250.0000 mg | ORAL_TABLET | Freq: Four times a day (QID) | ORAL | Status: AC

## 2014-01-08 NOTE — Discharge Instructions (Signed)

## 2014-01-08 NOTE — ED Notes (Signed)
Pt in nad. 

## 2014-01-08 NOTE — ED Provider Notes (Signed)
CSN: [id number]     Arrival date & time 01/08/14  1110 History  This chart was scribed for Roxy Horsemanobert Joshawa Dubin, PA-C, working with Mirian MoMatthew Gentry, MD by Chestine SporeSoijett Blue, ED Scribe. The patient was seen in room TR07C/TR07C at 12:02 PM.   Chief Complaint  Patient presents with  . Dental Pain     The history is provided by the patient. No language interpreter was used.   Connor Brennan is a 23 y.o. male who presents today complaining of progressively worsening left upper and lower dental pain onset 1 week. He states that his gums are bleeding when he eats, brushes his teeth, and even when he is not doing anything.  He states that his gums are swelling as well. He states that he is having associated symptoms of gum swelling and gum pain. He states that he has tried Advil with no relief for his symptoms. He denies fever and any other associated symptoms. He denies any allergies to any antibiotics.     Past Medical History  Diagnosis Date  . Asthma    History reviewed. No pertinent past surgical history. No family history on file. History  Substance Use Topics  . Smoking status: Current Every Day Smoker -- 0.50 packs/day  . Smokeless tobacco: Not on file  . Alcohol Use: Yes    Review of Systems  Constitutional: Negative for fever and chills.  HENT: Positive for dental problem (left upper and lower). Negative for drooling.        Gum swelling and gum bleeding   Neurological: Negative for speech difficulty.  Psychiatric/Behavioral: Positive for sleep disturbance.      Allergies  Eggs or egg-derived products and Shellfish allergy  Home Medications   Prior to Admission medications   Medication Sig Start Date End Date Taking? Authorizing Provider  levofloxacin (LEVAQUIN) 500 MG tablet Take 1 tablet (500 mg total) by mouth daily. 04/04/12   Lyanne CoKevin M Campos, MD  oseltamivir (TAMIFLU) 75 MG capsule Take 1 capsule (75 mg total) by mouth every 12 (twelve) hours. 04/04/12   Lyanne CoKevin M Campos, MD    BP 123/67  Pulse 78  Temp(Src) 97.9 F (36.6 C) (Oral)  Resp 18  Ht 6\' 1"  (1.854 m)  Wt 165 lb (74.844 kg)  BMI 21.77 kg/m2  SpO2 98%  Physical Exam  Nursing note and vitals reviewed. Constitutional: He is oriented to person, place, and time. He appears well-developed and well-nourished. No distress.  HENT:  Head: Normocephalic and atraumatic.  Mouth/Throat:    Poor dentition throughout.  Affected tooth as diagrammed.  No signs of peritonsillar or tonsillar abscess.  No signs of gingival abscess. Oropharynx is clear and without exudates.  Uvula is midline.  Airway is intact. No signs of Ludwig's angina with palpation of oral and sublingual mucosa.   Eyes: EOM are normal.  Neck: Neck supple. No tracheal deviation present.  Cardiovascular: Normal rate.   Pulmonary/Chest: Effort normal. No respiratory distress.  Musculoskeletal: Normal range of motion.  Neurological: He is alert and oriented to person, place, and time.  Skin: Skin is warm and dry.  Psychiatric: He has a normal mood and affect. His behavior is normal.    ED Course  Procedures (including critical care time) DIAGNOSTIC STUDIES: Oxygen Saturation is 98% on room air, normal by my interpretation.    COORDINATION OF CARE: 12:05 PM-Discussed treatment plan which includes Veetid with pt at bedside and pt agreed to plan.   Labs Review Labs Reviewed - No data to display  Imaging Review No results found.   EKG Interpretation None      MDM   Final diagnoses:  Pain, dental    Patient with toothache.  No gross abscess.  Exam unconcerning for Ludwig's angina or spread of infection.  Will treat with penicillin and OTC pain medicine.  Urged patient to follow-up with dentist.     I personally performed the services described in this documentation, which was scribed in my presence. The recorded information has been reviewed and is accurate.    Roxy Horsemanobert Andersyn Fragoso, PA-C 01/08/14 1209

## 2014-01-08 NOTE — ED Notes (Signed)
Patient states he has been having dental pain x 1 week.   Patient states upper and lower teeth on L side.   Patient states doesn't think he has any decay.  Patient states a lot of gum bleeding to area.

## 2014-01-08 NOTE — Discharge Planning (Signed)
[facility] Community Health & Eligibility Specialist ° °Spoke to patient regarding primary care resources and establishing care with a provider. Resource guide and my contact information provided for any future questions or concerns. No other Community Health & Eligibility Specialist needs identified at this time. °

## 2014-01-08 NOTE — ED Notes (Signed)
Pt reports left lower and left upper gum swelling x1 week. Pt reports bleeding and denies any foul odor or pus drainage.

## 2014-01-12 NOTE — ED Provider Notes (Signed)
Medical screening examination/treatment/procedure(s) were performed by non-physician practitioner and as supervising physician I was immediately available for consultation/collaboration.   EKG Interpretation None        Mirian MoMatthew Gentry, MD 01/12/14 [phone]

## 2014-06-21 ENCOUNTER — Emergency Department (HOSPITAL_COMMUNITY)
Admission: EM | Admit: 2014-06-21 | Discharge: 2014-06-21 | Disposition: A | Discharge status: Home / Self Care | Payer: No Typology Code available for payment source

## 2014-06-21 NOTE — ED Notes (Signed)
Called for triage x2 for triage, no answer

## 2014-06-21 NOTE — ED Notes (Signed)
Called x2 more times for triage with no answer

## 2015-08-11 ENCOUNTER — Encounter (HOSPITAL_COMMUNITY): Payer: Self-pay | Admitting: Emergency Medicine

## 2015-08-11 DIAGNOSIS — R1900 Intra-abdominal and pelvic swelling, mass and lump, unspecified site: Secondary | ICD-10-CM | POA: Diagnosis present

## 2015-08-11 DIAGNOSIS — J45909 Unspecified asthma, uncomplicated: Secondary | ICD-10-CM | POA: Insufficient documentation

## 2015-08-11 DIAGNOSIS — F17200 Nicotine dependence, unspecified, uncomplicated: Secondary | ICD-10-CM | POA: Insufficient documentation

## 2015-08-11 NOTE — ED Notes (Signed)
Pt. reports "knot" at left abdomen onset 2 weeks ago , denies injury , no nausea or diarrhea .

## 2015-08-12 ENCOUNTER — Emergency Department (HOSPITAL_COMMUNITY)
Admission: EM | Admit: 2015-08-12 | Discharge: 2015-08-12 | Disposition: A | Discharge status: Home / Self Care | Payer: PRIVATE HEALTH INSURANCE | Attending: Emergency Medicine | Admitting: Emergency Medicine

## 2015-08-12 NOTE — ED Notes (Signed)
Pt did not answer when called for room 

## 2015-08-12 NOTE — ED Notes (Signed)
Pt did not answer when name was called to go to a room.

## 2015-08-19 ENCOUNTER — Ambulatory Visit (HOSPITAL_COMMUNITY)
Admission: EM | Admit: 2015-08-19 | Discharge: 2015-08-19 | Disposition: A | Discharge status: Home / Self Care | Payer: PRIVATE HEALTH INSURANCE | Attending: Emergency Medicine | Admitting: Emergency Medicine

## 2015-08-19 ENCOUNTER — Encounter (HOSPITAL_COMMUNITY): Payer: Self-pay | Admitting: Emergency Medicine

## 2015-08-19 DIAGNOSIS — R21 Rash and other nonspecific skin eruption: Secondary | ICD-10-CM | POA: Diagnosis not present

## 2015-08-19 DIAGNOSIS — B86 Scabies: Secondary | ICD-10-CM

## 2015-08-19 MED ORDER — PERMETHRIN 5 % EX CREA: TOPICAL_CREAM | CUTANEOUS | Status: DC

## 2015-08-19 MED ORDER — PREDNISONE 20 MG PO TABS: ORAL_TABLET | ORAL | Status: DC

## 2015-08-19 NOTE — ED Provider Notes (Signed)
CSN: [id number]     Arrival date & time 08/19/15  1614 History   First MD Initiated Contact with Patient 08/19/15 1646     Chief Complaint  Patient presents with  . Rash   (Consider location/radiation/quality/duration/timing/severity/associated sxs/prior Treatment) HPI Comments: 25 year old male complaining of generalized rash starting about 2 weeks ago. Initially started with small flesh-colored papules that have spread from the upper extremities to the remainder of the body surface areas. This is primarily a flesh-colored densely populated  Papules. Patient denies systemic symptoms. Denies fatigue or malaise, fever, sore throat or other symptoms. As per nurse's note he does mention possible exposure to bedbugs or other type of insect at the outset of this rash.  Patient is a 25 y.o. male presenting with rash.  Rash   Past Medical History  Diagnosis Date  . Asthma    History reviewed. No pertinent past surgical history. No family history on file. Social History  Substance Use Topics  . Smoking status: Current Every Day Smoker -- 0.50 packs/day  . Smokeless tobacco: None  . Alcohol Use: Yes    Review of Systems  Constitutional: Negative.   HENT: Negative.   Respiratory: Negative.   Gastrointestinal: Negative.   Skin: Positive for rash.       Pruritic  Neurological: Negative.     Allergies  Eggs or egg-derived products and Shellfish allergy  Home Medications   Prior to Admission medications   Medication Sig Start Date End Date Taking? Authorizing Provider  levofloxacin (LEVAQUIN) 500 MG tablet Take 1 tablet (500 mg total) by mouth daily. 04/04/12   Azalia BilisKevin Campos, MD  oseltamivir (TAMIFLU) 75 MG capsule Take 1 capsule (75 mg total) by mouth every 12 (twelve) hours. 04/04/12   Azalia BilisKevin Campos, MD  permethrin (ELIMITE) 5 % cream Apply to affected area and rinse off in 8-10 hours. Repeat in 7 days 08/19/15   Hayden Rasmussenavid Norwood Quezada, NP  predniSONE (DELTASONE) 20 MG tablet Take 3 tabs po on first  day, 2 tabs second day, 2 tabs third day, 1 tab fourth day, 1 tab 5th day. Take with food. 08/19/15   Hayden Rasmussenavid Bryn Perkin, NP   Meds Ordered and Administered this Visit  Medications - No data to display  BP 123/64 mmHg  Pulse 74  Temp(Src) 98 F (36.7 C) (Oral)  Resp 20  SpO2 97% No data found.   Physical Exam  Constitutional: He is oriented to person, place, and time. He appears well-developed and well-nourished. No distress.  Eyes: EOM are normal.  Neck: Normal range of motion. Neck supple.  Cardiovascular: Normal rate.   Pulmonary/Chest: Effort normal. No respiratory distress.  Musculoskeletal: He exhibits no edema.  Neurological: He is alert and oriented to person, place, and time. He exhibits normal muscle tone.  Skin: Skin is warm and dry.  Densely populated papules occurring in a scattered pattern to the upper and lower extremities. There are several similar papules to the abdomen and lower back however the torso lesions are tightly grouped and in an ovoid shape in pattern.  Psychiatric: He has a normal mood and affect.  Nursing note and vitals reviewed.   ED Course  Procedures (including critical care time)  Labs Review Labs Reviewed - No data to display  Imaging Review No results found.   Visual Acuity Review  Right Eye Distance:   Left Eye Distance:   Bilateral Distance:    Right Eye Near:   Left Eye Near:    Bilateral Near:  MDM   1. Rash   2. Scabies    Portions of this rash, particularly on the extremities are more consistent with scabies. The lesions to the truck have some similarities to pityriasis lesions. Differential would also include a viral exanthem. Treat for infestation or bug bites at this time and symptomatically. Meds ordered this encounter  Medications  . permethrin (ELIMITE) 5 % cream    Sig: Apply to affected area and rinse off in 8-10 hours. Repeat in 7 days    Dispense:  120 g    Refill:  0    Order Specific Question:   Supervising Provider    Answer:  Charm Rings [id number]  . predniSONE (DELTASONE) 20 MG tablet    Sig: Take 3 tabs po on first day, 2 tabs second day, 2 tabs third day, 1 tab fourth day, 1 tab 5th day. Take with food.    Dispense:  9 tablet    Refill:  0    Order Specific Question:  Supervising Provider    Answer:  Micheline Chapman       Hayden Rasmussen, NP 08/19/15 1715

## 2015-08-19 NOTE — Discharge Instructions (Signed)

## 2015-08-19 NOTE — ED Notes (Signed)
Here for a rash all over body onset x2 weeks.... Reports poss exposure to bedbugs after visiting aunts home... Denies fevers, chills... A&O x4... No acute distress.

## 2015-09-28 ENCOUNTER — Ambulatory Visit (HOSPITAL_COMMUNITY)
Admission: EM | Admit: 2015-09-28 | Discharge: 2015-09-28 | Disposition: A | Discharge status: Home / Self Care | Payer: PRIVATE HEALTH INSURANCE | Attending: Internal Medicine | Admitting: Internal Medicine

## 2015-09-28 ENCOUNTER — Encounter (HOSPITAL_COMMUNITY): Payer: Self-pay | Admitting: Emergency Medicine

## 2015-09-28 DIAGNOSIS — IMO0002 Reserved for concepts with insufficient information to code with codable children: Secondary | ICD-10-CM

## 2015-09-28 DIAGNOSIS — T148 Other injury of unspecified body region: Secondary | ICD-10-CM

## 2015-09-28 MED ORDER — CEPHALEXIN 500 MG PO CAPS: 500.0000 mg | ORAL_CAPSULE | Freq: Two times a day (BID) | ORAL | Status: DC

## 2015-09-28 NOTE — ED Notes (Signed)
Patient reports noticing a laceration to posterior wrist today.  Patient says he noticed this around 6 or 7 this morning.  Patient denies knowing what happened to wrist.  Patient reports having a tetanus 3 years ago when released from prison 3 years ago.  Bleeding controlled

## 2015-09-28 NOTE — ED Provider Notes (Addendum)
CSN: [id number]     Arrival date & time 09/28/15  1716 History   First MD Initiated Contact with Patient 09/28/15 1805     Chief Complaint  Patient presents with  . Extremity Laceration   (Consider location/radiation/quality/duration/timing/severity/associated sxs/prior Treatment) HPI Comments: The patient suffered a laceration this morning. He cannot recall how he did it but remembers his wrist hurting and looking down seeing blood. He has had no loss of consciousness. He denies any swelling or weakness in his hand.  The history is provided by the patient.    Past Medical History  Diagnosis Date  . Asthma    History reviewed. No pertinent past surgical history. No family history on file. Social History  Substance Use Topics  . Smoking status: Current Every Day Smoker -- 0.50 packs/day  . Smokeless tobacco: None  . Alcohol Use: Yes    Review of Systems  Allergies  Eggs or egg-derived products and Shellfish allergy  Home Medications   Prior to Admission medications   Medication Sig Start Date End Date Taking? Authorizing Provider  cephALEXin (KEFLEX) 500 MG capsule Take 1 capsule (500 mg total) by mouth 2 (two) times daily. 09/28/15   Arnaldo Natal, MD  levofloxacin (LEVAQUIN) 500 MG tablet Take 1 tablet (500 mg total) by mouth daily. Patient not taking: Reported on 09/28/2015 04/04/12   Azalia Bilis, MD  oseltamivir (TAMIFLU) 75 MG capsule Take 1 capsule (75 mg total) by mouth every 12 (twelve) hours. Patient not taking: Reported on 09/28/2015 04/04/12   Azalia Bilis, MD  permethrin (ELIMITE) 5 % cream Apply to affected area and rinse off in 8-10 hours. Repeat in 7 days Patient not taking: Reported on 09/28/2015 08/19/15   Hayden Rasmussen, NP  predniSONE (DELTASONE) 20 MG tablet Take 3 tabs po on first day, 2 tabs second day, 2 tabs third day, 1 tab fourth day, 1 tab 5th day. Take with food. Patient not taking: Reported on 09/28/2015 08/19/15   Hayden Rasmussen, NP   Meds Ordered and  Administered this Visit  Medications - No data to display  BP 116/69 mmHg  Pulse 93  Temp(Src) 98 F (36.7 C) (Oral)  Resp 16  SpO2 99% No data found.   Physical Exam  Constitutional: He is oriented to person, place, and time. He appears well-developed and well-nourished. No distress.  HENT:  Head: Normocephalic and atraumatic.  Mouth/Throat: Oropharynx is clear and moist.  Eyes: Conjunctivae and EOM are normal. Pupils are equal, round, and reactive to light. No scleral icterus.  Neck: Normal range of motion. Neck supple. No JVD present. No tracheal deviation present. No thyromegaly present.  Cardiovascular: Normal rate, regular rhythm and normal heart sounds.  Exam reveals no gallop and no friction rub.   No murmur heard. Pulmonary/Chest: Effort normal and breath sounds normal. No respiratory distress.  Abdominal: Soft. Bowel sounds are normal. He exhibits no distension. There is no tenderness.  Genitourinary:  Deferred  Musculoskeletal: Normal range of motion. He exhibits no edema.  Lymphadenopathy:    He has no cervical adenopathy.  Neurological: He is alert and oriented to person, place, and time. No cranial nerve deficit.  Skin: Skin is warm and dry. No rash noted. No erythema.  2 cm linear wound; muscle tissue not involved.  Clean  Psychiatric: He has a normal mood and affect. His behavior is normal. Judgment and thought content normal.    ED Course  .Marland KitchenLaceration Repair Date/Time: 09/28/2015 6:32 PM Performed by: Arnaldo Natal Authorized  by: Simonne Boulos S Consent: Verbal consent obtained. Risks and benefits: risks, benefits and alternatives were discussed Consent given by: patient Patient understanding: patient states understanding of the procedure being performed Patient consent: the patient's understanding of the procedure matches consent given Procedure consent: procedure consent matches procedure scheduled Patient identity confirmed: verbally with  patient and arm band Body area: upper extremity Laceration length: 2 cm Foreign bodies: no foreign bodies Tendon involvement: none Nerve involvement: none Vascular damage: no Patient sedated: no Irrigation solution: saline Amount of cleaning: standard Debridement: none Degree of undermining: none Skin closure: glue Approximation: close Approximation difficulty: simple Dressing: 4x4 sterile gauze Patient tolerance: Patient tolerated the procedure well with no immediate complications   (including critical care time)  Labs Review Labs Reviewed - No data to display  Imaging Review No results found.   Visual Acuity Review  Right Eye Distance:   Left Eye Distance:   Bilateral Distance:    Right Eye Near:   Left Eye Near:    Bilateral Near:         MDM   1. Laceration    Clean wound, simple closure. Patient tolerated procedure well. Return to clinic for wound infection or evidence of sepsis including fever, nausea or vomiting. Keflex prescribed prophylactically.    Arnaldo NatalMichael S Jalene Lacko, MD 09/28/15 1836  Arnaldo NatalMichael S Dajae Kizer, MD 09/28/15 [phone]

## 2015-10-26 ENCOUNTER — Ambulatory Visit (HOSPITAL_COMMUNITY)
Admission: EM | Admit: 2015-10-26 | Discharge: 2015-10-26 | Disposition: A | Discharge status: Nursing Home (Includes ICF) | Payer: PRIVATE HEALTH INSURANCE | Attending: Family Medicine | Admitting: Family Medicine

## 2015-10-26 ENCOUNTER — Emergency Department (HOSPITAL_COMMUNITY): Payer: PRIVATE HEALTH INSURANCE

## 2015-10-26 ENCOUNTER — Encounter (HOSPITAL_COMMUNITY): Payer: Self-pay | Admitting: *Deleted

## 2015-10-26 ENCOUNTER — Encounter (HOSPITAL_COMMUNITY): Payer: Self-pay | Admitting: Emergency Medicine

## 2015-10-26 DIAGNOSIS — Z5321 Procedure and treatment not carried out due to patient leaving prior to being seen by health care provider: Secondary | ICD-10-CM | POA: Diagnosis not present

## 2015-10-26 DIAGNOSIS — W0110XA Fall on same level from slipping, tripping and stumbling with subsequent striking against unspecified object, initial encounter: Secondary | ICD-10-CM | POA: Diagnosis not present

## 2015-10-26 DIAGNOSIS — S0033XA Contusion of nose, initial encounter: Secondary | ICD-10-CM | POA: Diagnosis not present

## 2015-10-26 DIAGNOSIS — J45909 Unspecified asthma, uncomplicated: Secondary | ICD-10-CM | POA: Diagnosis not present

## 2015-10-26 DIAGNOSIS — Y999 Unspecified external cause status: Secondary | ICD-10-CM | POA: Diagnosis not present

## 2015-10-26 DIAGNOSIS — H02846 Edema of left eye, unspecified eyelid: Secondary | ICD-10-CM

## 2015-10-26 DIAGNOSIS — Z79899 Other long term (current) drug therapy: Secondary | ICD-10-CM | POA: Insufficient documentation

## 2015-10-26 DIAGNOSIS — Y9289 Other specified places as the place of occurrence of the external cause: Secondary | ICD-10-CM | POA: Insufficient documentation

## 2015-10-26 DIAGNOSIS — H518 Other specified disorders of binocular movement: Secondary | ICD-10-CM | POA: Diagnosis not present

## 2015-10-26 DIAGNOSIS — R51 Headache: Secondary | ICD-10-CM | POA: Insufficient documentation

## 2015-10-26 DIAGNOSIS — S0031XA Abrasion of nose, initial encounter: Secondary | ICD-10-CM | POA: Diagnosis not present

## 2015-10-26 DIAGNOSIS — Y939 Activity, unspecified: Secondary | ICD-10-CM | POA: Insufficient documentation

## 2015-10-26 DIAGNOSIS — S0081XA Abrasion of other part of head, initial encounter: Secondary | ICD-10-CM | POA: Insufficient documentation

## 2015-10-26 DIAGNOSIS — F17200 Nicotine dependence, unspecified, uncomplicated: Secondary | ICD-10-CM | POA: Diagnosis not present

## 2015-10-26 DIAGNOSIS — W1839XA Other fall on same level, initial encounter: Secondary | ICD-10-CM

## 2015-10-26 DIAGNOSIS — W010XXA Fall on same level from slipping, tripping and stumbling without subsequent striking against object, initial encounter: Secondary | ICD-10-CM

## 2015-10-26 MED ORDER — TETANUS-DIPHTH-ACELL PERTUSSIS 5-2.5-18.5 LF-MCG/0.5 IM SUSP
0.5000 mL | Freq: Once | INTRAMUSCULAR | Status: AC
  Administered 2015-10-26: 0.5 mL via INTRAMUSCULAR

## 2015-10-26 MED ORDER — TETANUS-DIPHTH-ACELL PERTUSSIS 5-2.5-18.5 LF-MCG/0.5 IM SUSP
INTRAMUSCULAR | Status: AC
  Filled 2015-10-26: qty 0.5

## 2015-10-26 NOTE — ED Triage Notes (Signed)
Pt  Reports  He  Larey SeatFell          And  Injured           His   l  Eye   And   Bridge of  The  Nose       He  Has  Swelling  And  Abrasions  Present     His  Vision is  20/25  l        20/25 r     20/25  Both       He  States      He     Slid  Into  A  Log   He  Did  Not  Black  Out

## 2015-10-26 NOTE — ED Notes (Signed)
REPORT   GIVEN  TO  NURSE  FIRST   KIM   IN  ER

## 2015-10-26 NOTE — ED Triage Notes (Signed)
Pt. slipped on grass last night and hit his face against as log , denies LOC/ambulatory , presents with skin abrasion at left upper face with swelling . Respirations unlabored .

## 2015-10-26 NOTE — ED Provider Notes (Signed)
CSN: [id number]     Arrival date & time 10/26/15  1911 History   First MD Initiated Contact with Patient 10/26/15 2048     Chief Complaint  Patient presents with  . Eye Injury   (Consider location/radiation/quality/duration/timing/severity/associated sxs/prior Treatment) 25 year old male states he fell last night and struck his face on a log  period he presents to the urgent care this evening complaining of pain over the nose with there is an abrasion as well as swelling to the left eye. He denies changes in vision. Denies loss of consciousness. Denies hx of "lazy eye".      Past Medical History:  Diagnosis Date  . Asthma    History reviewed. No pertinent surgical history. History reviewed. No pertinent family history. Social History  Substance Use Topics  . Smoking status: Current Every Day Smoker    Packs/day: 0.50  . Smokeless tobacco: Not on file  . Alcohol use Yes    Review of Systems  Constitutional: Negative.  Negative for activity change and fatigue.  HENT: Positive for facial swelling. Negative for congestion, postnasal drip, sore throat, trouble swallowing and voice change.   Eyes: Negative for photophobia, pain, discharge and visual disturbance.  Respiratory: Negative.   Genitourinary: Negative.   Musculoskeletal: Negative.   Neurological: Positive for facial asymmetry. Negative for dizziness, tremors, syncope, speech difficulty, light-headedness, numbness and headaches.  All other systems reviewed and are negative.   Allergies  Eggs or egg-derived products and Shellfish allergy  Home Medications   Prior to Admission medications   Medication Sig Start Date End Date Taking? Authorizing Provider  cephALEXin (KEFLEX) 500 MG capsule Take 1 capsule (500 mg total) by mouth 2 (two) times daily. 09/28/15   Arnaldo Natal, MD  levofloxacin (LEVAQUIN) 500 MG tablet Take 1 tablet (500 mg total) by mouth daily. Patient not taking: Reported on 09/28/2015 04/04/12   Azalia Bilis, MD  oseltamivir (TAMIFLU) 75 MG capsule Take 1 capsule (75 mg total) by mouth every 12 (twelve) hours. Patient not taking: Reported on 09/28/2015 04/04/12   Azalia Bilis, MD  permethrin (ELIMITE) 5 % cream Apply to affected area and rinse off in 8-10 hours. Repeat in 7 days Patient not taking: Reported on 09/28/2015 08/19/15   Hayden Rasmussen, NP  predniSONE (DELTASONE) 20 MG tablet Take 3 tabs po on first day, 2 tabs second day, 2 tabs third day, 1 tab fourth day, 1 tab 5th day. Take with food. Patient not taking: Reported on 09/28/2015 08/19/15   Hayden Rasmussen, NP   Meds Ordered and Administered this Visit   Medications  Tdap (BOOSTRIX) injection 0.5 mL (not administered)    BP 128/80 (BP Location: Right Arm)   Pulse 78   Temp 98.6 F (37 C) (Oral)   Resp 20   SpO2 99%  No data found.   Physical Exam  Constitutional: He appears well-developed and well-nourished. No distress.  HENT:  Head: Normocephalic.  Mouth/Throat: Oropharynx is clear and moist.  Abrasion with minor swelling over the upper nasal bridge in the glabella.  Eyes:  Swelling in the left upper and lower eyelids. Disconjugate gaze. Both eyes have full EOM but are not is conjugant. There is a lag of the left eye. (The patient affirms that he has never had a problem with his eyes. He has never had a "lazy eye or any other abnormalities that he knows of.) No bleeding. No hyphema. Conjunctiva with minor erythema. Sclera clear. Anterior chamber clear. No diplopia.  Neck: Normal  range of motion. Neck supple.  No spinal tenderness. Full range of motion. Mild tenderness to the right paraspinal musculature.  Cardiovascular: Normal rate and regular rhythm.   Pulmonary/Chest: Effort normal.  Musculoskeletal: Normal range of motion.  Lymphadenopathy:    He has no cervical adenopathy.  Skin: Skin is warm and dry.  Psychiatric: He has a normal mood and affect.  Nursing note and vitals reviewed.   Urgent Care Course   Clinical  Course    Procedures (including critical care time)  Labs Review Labs Reviewed - No data to display  Imaging Review No results found.   Visual Acuity Review  Right Eye Distance:   Left Eye Distance:   Bilateral Distance:    Right Eye Near:   Left Eye Near:    Bilateral Near:         MDM   1. Fall from slip, trip, or stumble, initial encounter   2. Edema of eyelid, left   3. Nasal contusion, initial encounter   4. Abrasion of nose, initial encounter   5. Dysconjugate gaze    [facility] due to disconjugate gaze status post trauma to the face and left eye. Pt insists has never had a "lazy eye" or other abnormality when describing abnormal eye movement.     Hayden Rasmussenavid Azariya Freeman, NP 10/26/15 2116

## 2015-10-27 ENCOUNTER — Emergency Department (HOSPITAL_COMMUNITY)
Admission: EM | Admit: 2015-10-27 | Discharge: 2015-10-27 | Disposition: A | Discharge status: Home / Self Care | Payer: PRIVATE HEALTH INSURANCE | Attending: Physician Assistant | Admitting: Physician Assistant

## 2015-10-27 NOTE — ED Notes (Signed)
Patient's name was called but no answer 

## 2015-10-27 NOTE — ED Notes (Signed)
Patient's name was called again still no answer 

## 2017-09-26 ENCOUNTER — Encounter (HOSPITAL_COMMUNITY): Payer: Self-pay | Admitting: Emergency Medicine

## 2017-09-26 ENCOUNTER — Emergency Department (HOSPITAL_COMMUNITY)
Admission: EM | Admit: 2017-09-26 | Discharge: 2017-09-27 | Disposition: A | Discharge status: Home / Self Care | Payer: PRIVATE HEALTH INSURANCE | Attending: Emergency Medicine | Admitting: Emergency Medicine

## 2017-09-26 DIAGNOSIS — Y929 Unspecified place or not applicable: Secondary | ICD-10-CM | POA: Insufficient documentation

## 2017-09-26 DIAGNOSIS — F17210 Nicotine dependence, cigarettes, uncomplicated: Secondary | ICD-10-CM | POA: Insufficient documentation

## 2017-09-26 DIAGNOSIS — S41112A Laceration without foreign body of left upper arm, initial encounter: Secondary | ICD-10-CM | POA: Insufficient documentation

## 2017-09-26 DIAGNOSIS — Z23 Encounter for immunization: Secondary | ICD-10-CM | POA: Insufficient documentation

## 2017-09-26 DIAGNOSIS — Y9389 Activity, other specified: Secondary | ICD-10-CM | POA: Insufficient documentation

## 2017-09-26 DIAGNOSIS — Y998 Other external cause status: Secondary | ICD-10-CM | POA: Insufficient documentation

## 2017-09-26 DIAGNOSIS — J45909 Unspecified asthma, uncomplicated: Secondary | ICD-10-CM | POA: Insufficient documentation

## 2017-09-26 DIAGNOSIS — W25XXXA Contact with sharp glass, initial encounter: Secondary | ICD-10-CM | POA: Insufficient documentation

## 2017-09-26 NOTE — ED Triage Notes (Signed)
Pt presents to ER with laceration to LUE when he accidentally pushed through a glass door that shattered; pt states he called 911 and EMS came out and dressed his wound and he came here by private vehicle; pt having trouble with ROM to 1st 3 fingers on L hand Initial injury occurred at 2200

## 2017-09-27 ENCOUNTER — Emergency Department (HOSPITAL_COMMUNITY): Payer: PRIVATE HEALTH INSURANCE

## 2017-09-27 MED ORDER — IBUPROFEN 600 MG PO TABS: 600.0000 mg | ORAL_TABLET | Freq: Four times a day (QID) | ORAL | 0 refills | Status: DC | PRN

## 2017-09-27 MED ORDER — OXYCODONE-ACETAMINOPHEN 5-325 MG PO TABS
1.0000 | ORAL_TABLET | Freq: Once | ORAL | Status: AC
  Administered 2017-09-27: 1 via ORAL
  Filled 2017-09-27: qty 1

## 2017-09-27 MED ORDER — TETANUS-DIPHTH-ACELL PERTUSSIS 5-2.5-18.5 LF-MCG/0.5 IM SUSP
0.5000 mL | Freq: Once | INTRAMUSCULAR | Status: AC
  Administered 2017-09-27: 0.5 mL via INTRAMUSCULAR
  Filled 2017-09-27: qty 0.5

## 2017-09-27 MED ORDER — CEPHALEXIN 500 MG PO CAPS: 1000.0000 mg | ORAL_CAPSULE | Freq: Two times a day (BID) | ORAL | 0 refills | Status: DC

## 2017-09-27 NOTE — ED Notes (Signed)
Pt. To XRAY.

## 2017-09-27 NOTE — ED Notes (Signed)
Patient transported to X-ray 

## 2017-09-27 NOTE — ED Notes (Signed)
Pt verbalized understanding of discharge instructions and prescriptions. Pt verbalized understanding. Wound dressed and pt given supplies

## 2017-09-27 NOTE — ED Notes (Signed)
ED Provider at bedside. 

## 2017-09-27 NOTE — ED Provider Notes (Signed)
[facility] EM[facility]ENC[facility] Provider Note   CSN: [id number] Arrival date & time: 09/26/17  2318     History   Chief Complaint Chief Complaint  Patient presents with  . Laceration    HPI Virgie DadKelvin Laplante is a 27 y.o. male.  Patient presents with laceration to left upper arm. He reports pushing on a glass door to open it and it shattered causing laceration across the upper arm. No other injury. He states that since the accident he has been unable to fully flex the thumb and index finger of the left hand. Tetanus is out of date.  The history is provided by the patient. No language interpreter was used.  Laceration      Past Medical History:  Diagnosis Date  . Asthma     There are no active problems to display for this patient.   Past Surgical History:  Procedure Laterality Date  . HAND SURGERY Right         Home Medications    Prior to Admission medications   Medication Sig Start Date End Date Taking? Authorizing Provider  cephALEXin (KEFLEX) 500 MG capsule Take 1 capsule (500 mg total) by mouth 2 (two) times daily. 09/28/15   Arnaldo Nataliamond, Michael S, MD  levofloxacin (LEVAQUIN) 500 MG tablet Take 1 tablet (500 mg total) by mouth daily. Patient not taking: Reported on 09/28/2015 04/04/12   Azalia Bilisampos, Kevin, MD  oseltamivir (TAMIFLU) 75 MG capsule Take 1 capsule (75 mg total) by mouth every 12 (twelve) hours. Patient not taking: Reported on 09/28/2015 04/04/12   Azalia Bilisampos, Kevin, MD  permethrin (ELIMITE) 5 % cream Apply to affected area and rinse off in 8-10 hours. Repeat in 7 days Patient not taking: Reported on 09/28/2015 08/19/15   Hayden RasmussenMabe, David, NP  predniSONE (DELTASONE) 20 MG tablet Take 3 tabs po on first day, 2 tabs second day, 2 tabs third day, 1 tab fourth day, 1 tab 5th day. Take with food. Patient not taking: Reported on 09/28/2015 08/19/15   Hayden RasmussenMabe, David, NP    Family History History reviewed. No pertinent family history.  Social History Social  History   Tobacco Use  . Smoking status: Current Every Day Smoker    Packs/day: 0.50    Types: Cigarettes  Substance Use Topics  . Alcohol use: Yes    Comment: social  . Drug use: Yes    Types: Marijuana     Allergies   Eggs or egg-derived products and Shellfish allergy   Review of Systems Review of Systems  Musculoskeletal:       See HPI.  Skin: Positive for wound.  Neurological: Positive for weakness and numbness.     Physical Exam Updated Vital Signs BP 125/80 (BP Location: Right Arm)   Pulse 100   Temp 98.6 F (37 C) (Oral)   Resp 16   Ht 6' (1.829 m)   Wt 79.4 kg (175 lb)   SpO2 97%   BMI 23.73 kg/m   Physical Exam  Musculoskeletal:  Full elbow and wrist flexion and extension. Left index and thumb extension normal but flexion is absent at the PIP joints. There is flexion at the MCP joints.   Neurological: A sensory deficit (Sensory deficit on palmar aspect left index and thumb. ) is present.  Skin:  Full thickness, transverse laceration distal third anterior upper left arm. No foreign body palpable. Minimal swelling.      ED Treatments / Results  Labs (all labs ordered are listed, but only abnormal  results are displayed) Labs Reviewed - No data to display  EKG None  Radiology No results found.  Procedures .Marland KitchenLaceration Repair Date/Time: 09/27/2017 4:32 AM Performed by: Elpidio Anis, PA-C Authorized by: Elpidio Anis, PA-C   Consent:    Consent obtained:  Verbal   Consent given by:  Patient Anesthesia (see MAR for exact dosages):    Anesthesia method:  Local infiltration   Local anesthetic:  Lidocaine 2% WITH epi Laceration details:    Location:  Shoulder/arm   Shoulder/arm location:  L upper arm   Length (cm):  4 Repair type:    Repair type:  Simple Pre-procedure details:    Preparation:  Patient was prepped and draped in usual sterile fashion Exploration:    Hemostasis achieved with:  Direct pressure   Wound exploration: entire  depth of wound probed and visualized     Wound exploration comment:  There is not felt to be tracking of the wound further than SQ    Contaminated: no   Treatment:    Area cleansed with:  Betadine and saline   Amount of cleaning:  Standard   Irrigation solution:  Sterile saline Skin repair:    Repair method:  Sutures   Suture size:  4-0   Suture material:  Prolene   Suture technique:  Simple interrupted Approximation:    Approximation:  Close Post-procedure details:    Patient tolerance of procedure:  Tolerated well, no immediate complications   (including critical care time)  Medications Ordered in ED Medications - No data to display   Initial Impression / Assessment and Plan / ED Course  I have reviewed the triage vital signs and the nursing notes.  Pertinent labs & imaging results that were available during my care of the patient were reviewed by me and considered in my medical decision making (see chart for details).     Patient to ED with laceration to left upper arm caused by broken glass pane of door. He reports sharp pain that extends specifically to thumb and index finger of the hand. He has flexion deficit at PIP of both fingers as well as decreased sensation on palmar aspect.   Imaging negative for visualized FB. No FB visualized on exam during wound closure. Dr. Merlyn Lot (hand) paged for consultation given the deficits in the hand. Tetanus updated.   Discussed with Dr. Merlyn Lot who advised patient can be discharged with follow up in one week in his office for re-evaluation of finger deficits found on exam tonight. Will start on Keflex. Care instructions provided.     Final Clinical Impressions(s) / ED Diagnoses   Final diagnoses:  None   1. Left upper arm laceration  ED Discharge Orders    None       Elpidio Anis, Cordelia Poche 09/27/17 1610    Dione Booze, MD 09/27/17 [phone]

## 2017-11-15 ENCOUNTER — Emergency Department (HOSPITAL_COMMUNITY)
Admission: EM | Admit: 2017-11-15 | Discharge: 2017-11-15 | Disposition: A | Discharge status: Home / Self Care | Payer: PRIVATE HEALTH INSURANCE | Attending: Emergency Medicine | Admitting: Emergency Medicine

## 2017-11-15 ENCOUNTER — Other Ambulatory Visit: Payer: Self-pay

## 2017-11-15 ENCOUNTER — Encounter (HOSPITAL_COMMUNITY): Payer: Self-pay | Admitting: Emergency Medicine

## 2017-11-15 DIAGNOSIS — Z79899 Other long term (current) drug therapy: Secondary | ICD-10-CM | POA: Insufficient documentation

## 2017-11-15 DIAGNOSIS — R202 Paresthesia of skin: Secondary | ICD-10-CM | POA: Insufficient documentation

## 2017-11-15 DIAGNOSIS — J45909 Unspecified asthma, uncomplicated: Secondary | ICD-10-CM | POA: Insufficient documentation

## 2017-11-15 DIAGNOSIS — F17210 Nicotine dependence, cigarettes, uncomplicated: Secondary | ICD-10-CM | POA: Insufficient documentation

## 2017-11-15 NOTE — ED Notes (Signed)
Patient given discharge instructions and verbalized understanding.  Patient stable to discharge at this time.  Patient is alert and oriented to baseline.  No distressed noted at this time.  All belongings taken with the patient at discharge.   

## 2017-11-15 NOTE — ED Triage Notes (Signed)
Pt seen 7/11 for laceration of left upper arm.  States he was told the feeling would come back in his fingers on his left hand but he continues to have numbness on anterior side of thumb, index finger, middle finger and half of palm.

## 2017-11-15 NOTE — ED Provider Notes (Signed)
[facility] EMERGENCY DEPARTMENT Provider Note   CSN: [id number] Arrival date & time: 11/15/17  2157     History   Chief Complaint Chief Complaint  Patient presents with  . Numbness    HPI Connor Brennan is a 27 y.o. male who presents for evaluation of left upper extremity paresthesia that is been ongoing since 09/26/2016 after he had a laceration.  Patient was seen here on 09/26/2017 for evaluation of laceration to the left upper arm.  This occurred after a glass door broke, causing a laceration.  At that time, he did have some numbness of his left hand.  Patient had some decreased sensation noted to the palmar aspect of left index and thumb.  On discharge, patient was given a hand follow-up and instructed to follow-up in the office on an outpatient basis.  He comes in the ED today because he is still continuing to having numbness to the area.  He denies any new trauma, injury.  Patient states he did not follow-up with the referred hand surgeon.  Patient denies any fevers,warmth, erythema.   The history is provided by the patient.    Past Medical History:  Diagnosis Date  . Asthma     There are no active problems to display for this patient.   Past Surgical History:  Procedure Laterality Date  . HAND SURGERY Right         Home Medications    Prior to Admission medications   Medication Sig Start Date End Date Taking? Authorizing Provider  cephALEXin (KEFLEX) 500 MG capsule Take 2 capsules (1,000 mg total) by mouth 2 (two) times daily. 09/27/17   Elpidio Anis, PA-C  ibuprofen (ADVIL,MOTRIN) 600 MG tablet Take 1 tablet (600 mg total) by mouth every 6 (six) hours as needed. 09/27/17   Elpidio Anis, PA-C  levofloxacin (LEVAQUIN) 500 MG tablet Take 1 tablet (500 mg total) by mouth daily. Patient not taking: Reported on 09/28/2015 04/04/12   Azalia Bilis, MD  oseltamivir (TAMIFLU) 75 MG capsule Take 1 capsule (75 mg total) by mouth every 12 (twelve)  hours. Patient not taking: Reported on 09/28/2015 04/04/12   Azalia Bilis, MD  permethrin (ELIMITE) 5 % cream Apply to affected area and rinse off in 8-10 hours. Repeat in 7 days Patient not taking: Reported on 09/28/2015 08/19/15   Hayden Rasmussen, NP  predniSONE (DELTASONE) 20 MG tablet Take 3 tabs po on first day, 2 tabs second day, 2 tabs third day, 1 tab fourth day, 1 tab 5th day. Take with food. Patient not taking: Reported on 09/28/2015 08/19/15   Hayden Rasmussen, NP    Family History No family history on file.  Social History Social History   Tobacco Use  . Smoking status: Current Every Day Smoker    Packs/day: 0.50    Types: Cigarettes  . Smokeless tobacco: Never Used  Substance Use Topics  . Alcohol use: Yes    Comment: social  . Drug use: Yes    Types: Marijuana     Allergies   Eggs or egg-derived products and Shellfish allergy   Review of Systems Review of Systems  Constitutional: Negative for fever.  Skin: Negative for wound.  Neurological: Positive for numbness (Chronic).  All other systems reviewed and are negative.    Physical Exam Updated Vital Signs BP 136/87 (BP Location: Right Arm)   Pulse 99   Temp 98.4 F (36.9 C) (Oral)   Resp 16   Ht 6' (1.829 m)   [employer]  79.4 kg   SpO2 100%   BMI 23.73 kg/m   Physical Exam  Constitutional: He appears well-developed and well-nourished.  HENT:  Head: Normocephalic and atraumatic.  Eyes: Conjunctivae and EOM are normal. Right eye exhibits no discharge. Left eye exhibits no discharge. No scleral icterus.  Cardiovascular:  Pulses:      Radial pulses are 2+ on the right side, and 2+ on the left side.  Pulmonary/Chest: Effort normal.  Musculoskeletal:  Limited flexion of left index finger which the patient states is chronic from his injury. Extension intact. No tenderness palpation to the finger.  Flexion/extension of all other digits intact without difficulty.  Left index finger is without any warmth, erythema, edema.   Neurological: He is alert. A sensory deficit is present.  Decreased sensation noted to palmar aspect of the left index finger and thumb.  Patient otherwise intact for all other major dermatomes.  Skin: Skin is warm and dry. Capillary refill takes less than 2 seconds.  Good distal cap refill. LUE is not dusky in appearance or cool to touch.  Psychiatric: He has a normal mood and affect. His speech is normal and behavior is normal.  Nursing note and vitals reviewed.    ED Treatments / Results  Labs (all labs ordered are listed, but only abnormal results are displayed) Labs Reviewed - No data to display  EKG None  Radiology No results found.  Procedures Procedures (including critical care time)  Medications Ordered in ED Medications - No data to display   Initial Impression / Assessment and Plan / ED Course  I have reviewed the triage vital signs and the nursing notes.  Pertinent labs & imaging results that were available during my care of the patient were reviewed by me and considered in my medical decision making (see chart for details).     27 year old male who presents for evaluation of persistent paresthesias after laceration to left upper arm that occurred on 09/26/2017.  Was discharged home with instructions to follow-up with Dr. Merlyn LotKuzma (hand surgeon) but states he never did.  No new injury. Patient is afebrile, non-toxic appearing, sitting comfortably on examination table. Vital signs reviewed and stable.  Patient with good radial pulse and good distal cap refill.  Suspect that this is persistent paresthesia secondary from wound.  Patient never followed up with referred hand surgeon.  History/physical exam is not concerning for septic arthritis, acute arterial embolism.  No indication for further imaging.  Exam is not concerning for flexor tenosynovitis.  Instructed patient to follow-up with referred outpatient hand surgeon for further evaluation and treatment of his symptoms.  Patient had ample opportunity for questions and discussion. All patient's questions were answered with full understanding. Strict return precautions discussed. Patient expresses understanding and agreement to plan.   Final Clinical Impressions(s) / ED Diagnoses   Final diagnoses:  Paresthesia    ED Discharge Orders    None       Rosana HoesLayden, Nayali Talerico A, PA-C 11/15/17 2239    Maia PlanLong, Joshua G, MD 11/16/17 [phone]

## 2017-11-15 NOTE — Discharge Instructions (Signed)
You can take Tylenol or Ibuprofen as directed for pain. You can alternate Tylenol and Ibuprofen every 4 hours. If you take Tylenol at 1pm, then you can take Ibuprofen at 5pm. Then you can take Tylenol again at 9pm.   Follow-up with referred hand doctor for further evaluation.  Return to the Emergency Department for any worsening or concerning symptoms.

## 2018-11-06 ENCOUNTER — Ambulatory Visit (INDEPENDENT_AMBULATORY_CARE_PROVIDER_SITE_OTHER): Payer: Self-pay

## 2018-11-06 ENCOUNTER — Ambulatory Visit (HOSPITAL_COMMUNITY)
Admission: EM | Admit: 2018-11-06 | Discharge: 2018-11-06 | Disposition: A | Discharge status: Home / Self Care | Payer: Self-pay | Attending: Family Medicine | Admitting: Family Medicine

## 2018-11-06 ENCOUNTER — Other Ambulatory Visit: Payer: Self-pay

## 2018-11-06 ENCOUNTER — Encounter (HOSPITAL_COMMUNITY): Payer: Self-pay | Admitting: Emergency Medicine

## 2018-11-06 DIAGNOSIS — M7989 Other specified soft tissue disorders: Secondary | ICD-10-CM

## 2018-11-06 DIAGNOSIS — M79672 Pain in left foot: Secondary | ICD-10-CM

## 2018-11-06 MED ORDER — PREDNISONE 10 MG (21) PO TBPK: ORAL_TABLET | Freq: Every day | ORAL | 0 refills | Status: DC

## 2018-11-06 NOTE — ED Provider Notes (Signed)
[name]   [id number] 11/06/18 Arrival Time: 3976  ASSESSMENT & PLAN:  1. Left foot pain   2. Swelling of left foot     I have personally viewed the imaging studies ordered this visit. No fractures or signs of stress fracture appreciated. No signs of cellulites.  Begin trial of: Meds ordered this encounter  Medications  . predniSONE (STERAPRED UNI-PAK 21 TAB) 10 MG (21) TBPK tablet    Sig: Take by mouth daily. Take as directed.    Dispense:  21 tablet    Refill:  0   OTC ibuprofen. WBAT. Work note provided.  Follow-up Information    [place].   Specialty: Urgent Care Why: As needed. Contact information: [address] [phone]          Reviewed expectations re: course of current medical issues. Questions answered. Outlined signs and symptoms indicating need for more acute intervention. Patient verbalized understanding. After Visit Summary given.  SUBJECTIVE: History from: patient. Connor Brennan is a 28 y.o. male who reports fairly persistent mild to moderate pain of his left foot; also with swelling; pain described as aching without radiation. Onset: gradual, over the past week. Injury/trama: no. 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 Symptoms have progressed to a point and plateaued since beginning. Aggravating factors: weight bearing2. Alleviating factors: rest. Associated symptoms: none reported. Extremity sensation changes or weakness: none. Self treatment: has not tried OTCs for relief of pain. History of similar: no. Afebrile. No skin erythema. No wounds. No rashes.  Past Surgical History:  Procedure Laterality Date  . HAND SURGERY Right      ROS: As per HPI. All other systems negative.    OBJECTIVE:  Vitals:   11/06/18 1445  BP: 136/73  Pulse: 73  Resp: 16  Temp: 98.5 F (36.9 C)  TempSrc: Oral  SpO2: 99%    General  appearance: alert; no distress HEENT: [place]; AT Neck: supple with FROM Resp: unlabored respirations Extremities: . LLE: warm and well perfused; fairly well localized moderate tenderness over left dorsal foot; without gross deformities; with moderate swelling (no pitting edema); with no bruising; ROM: normal at ankle CV: brisk extremity capillary refill of LLE; 2+ DP and PT pulse of LLE. Skin: warm and dry; no visible rashes Neurologic: gait normal; normal reflexes of RLE and LLE; normal sensation of RLE and LLE; normal strength of RLE and LLE Psychological: alert and cooperative; normal mood and affect  Imaging: Dg Foot Complete Left  Result Date: 11/06/2018 CLINICAL DATA:  Acute left foot pain without known injury. EXAM: LEFT FOOT - COMPLETE 3+ VIEW COMPARISON:  Radiographs of September 27, 2003. FINDINGS: There is no evidence of fracture or dislocation. There is no evidence of arthropathy or other focal bone abnormality. Soft tissues are unremarkable. IMPRESSION: Negative. Electronically Signed   By: Marijo Conception M.D.   On: 11/06/2018 15:31      Allergies  Allergen Reactions  . Eggs Or Egg-Derived Products Hives    Patient doesn't think he is allergic to eggs anymore  . Shellfish Allergy Swelling    face    Past Medical History:  Diagnosis Date  . Asthma    Social History   Socioeconomic History  . Marital status: Single    Spouse name: Not on file  . Number of children: Not on file  . Years of education: Not on file  . Highest education level:  Not on file  Occupational History  . Not on file  Social Needs  . Financial resource strain: Not on file  . Food insecurity    Worry: Not on file    Inability: Not on file  . Transportation needs    Medical: Not on file    Non-medical: Not on file  Tobacco Use  . Smoking status: Current Every Day Smoker    Packs/day: 0.50    Types: Cigarettes  . Smokeless tobacco: Never Used  Substance and Sexual Activity  . Alcohol use: Yes     Comment: social  . Drug use: Yes    Types: Marijuana  . Sexual activity: Not on file  Lifestyle  . Physical activity    Days per week: Not on file    Minutes per session: Not on file  . Stress: Not on file  Relationships  . Social Musicianconnections    Talks on phone: Not on file    Gets together: Not on file    Attends religious service: Not on file    Active member of club or organization: Not on file    Attends meetings of clubs or organizations: Not on file    Relationship status: Not on file  Other Topics Concern  . Not on file  Social History Narrative  . Not on file   FH: Question of HTN  Past Surgical History:  Procedure Laterality Date  . HAND SURGERY Right       Mardella LaymanHagler, Coltyn Hanning, MD 11/06/18 [phone]

## 2018-11-06 NOTE — ED Triage Notes (Signed)
PT reports left foot pain and swelling for 1 week. No injury.

## 2019-08-31 ENCOUNTER — Encounter (HOSPITAL_COMMUNITY): Payer: Self-pay

## 2019-08-31 ENCOUNTER — Other Ambulatory Visit: Payer: Self-pay

## 2019-08-31 ENCOUNTER — Ambulatory Visit (HOSPITAL_COMMUNITY)
Admission: EM | Admit: 2019-08-31 | Discharge: 2019-08-31 | Disposition: A | Discharge status: Home / Self Care | Payer: Self-pay | Attending: Family Medicine | Admitting: Family Medicine

## 2019-08-31 DIAGNOSIS — R197 Diarrhea, unspecified: Secondary | ICD-10-CM

## 2019-08-31 DIAGNOSIS — M25871 Other specified joint disorders, right ankle and foot: Secondary | ICD-10-CM

## 2019-08-31 NOTE — ED Provider Notes (Signed)
[facility]    CSN: [id number] Arrival date & time: 08/31/19  1714      History   Chief Complaint Chief Complaint  Patient presents with  . Abdominal Pain  . Ankle Pain    HPI Connor Brennan is a 29 y.o. male.   He is presenting with upper abdominal pain and diarrhea as well as right medial sided ankle pain.  He reports injuring his ankle 2 years ago.  Since that time he is experience pain on the medial aspect intermittently.  The pain is worse with prolonged walking.  Denies any swelling or ecchymosis.  The abdominal pain is upper quadrant.  He notices in the morning and does not notice it through the course the day.  He has watery diarrhea.  It is nonbloody.  He does not associate it with any specific food.  Denies fevers.  HPI  Past Medical History:  Diagnosis Date  . Asthma     There are no problems to display for this patient.   Past Surgical History:  Procedure Laterality Date  . HAND SURGERY Right        Home Medications    Prior to Admission medications   Medication Sig Start Date End Date Taking? Authorizing Provider  cephALEXin (KEFLEX) 500 MG capsule Take 2 capsules (1,000 mg total) by mouth 2 (two) times daily. 09/27/17   Elpidio Anis, PA-C  ibuprofen (ADVIL,MOTRIN) 600 MG tablet Take 1 tablet (600 mg total) by mouth every 6 (six) hours as needed. 09/27/17   Elpidio Anis, PA-C  predniSONE (STERAPRED UNI-PAK 21 TAB) 10 MG (21) TBPK tablet Take by mouth daily. Take as directed. 11/06/18   Mardella Layman, MD    Family History Family History  Problem Relation Age of Onset  . Healthy Mother   . Healthy Father     Social History Social History   Tobacco Use  . Smoking status: Current Every Day Smoker    Packs/day: 0.50    Types: Cigarettes  . Smokeless tobacco: Never Used  Substance Use Topics  . Alcohol use: Yes    Comment: social  . Drug use: Yes    Types: Marijuana     Allergies   Eggs or egg-derived products and Shellfish  allergy   Review of Systems Review of Systems  See HPI  Physical Exam Triage Vital Signs ED Triage Vitals  Enc Vitals Group     BP --      Pulse --      Resp 08/31/19 1743 18     Temp 08/31/19 1743 98 F (36.7 C)     Temp Source 08/31/19 1737 Oral     SpO2 --      Weight --      Height --      Head Circumference --      Peak Flow --      Pain Score 08/31/19 1741 0     Pain Loc --      Pain Edu? --      Excl. in GC? --    No data found.  Updated Vital Signs BP 121/69 (BP Location: Right Arm)   Pulse 92   Temp 98 F (36.7 C) (Oral)   Resp 18   SpO2 98%   Visual Acuity Right Eye Distance:   Left Eye Distance:   Bilateral Distance:    Right Eye Near:   Left Eye Near:    Bilateral Near:     Physical Exam Gen: NAD, alert,  cooperative with exam, well-appearing ENT: normal lips, normal nasal mucosa,  Resp: no accessory muscle use, non-labored,  GI: Soft, nontender, nondistended, positive bowel sounds Skin: no rashes, no areas of induration  Neuro: normal tone, normal sensation to touch Psych:  normal insight, alert and oriented MSK:  Right ankle: No ecchymosis or swelling. Pes planus. Tenderness palpation in the anterior medial aspect of the ankle. Neurovascularly intact   UC Treatments / Results  Labs (all labs ordered are listed, but only abnormal results are displayed) Labs Reviewed - No data to display  EKG   Radiology No results found.  Procedures Procedures (including critical care time)  Medications Ordered in UC Medications - No data to display  Initial Impression / Assessment and Plan / UC Course  I have reviewed the triage vital signs and the nursing notes.  Pertinent labs & imaging results that were available during my care of the patient were reviewed by me and considered in my medical decision making (see chart for details).     Connor Brennan is a 29 year old male that is presenting with right ankle pain secondary to impingement with  pes planus.  His abdominal pain and diarrhea are nonspecific.  He reports a 69-month history of watery diarrhea.  Possible for allergy mediated.  Seems less likely for inflammatory.  Counseled on home exercise therapy and supportive care.  Advised to follow-up with primary care in order to have lab work and stool studies performed.  Give indications to follow-up and return.  Final Clinical Impressions(s) / UC Diagnoses   Final diagnoses:  Impingement syndrome of right ankle  Diarrhea, unspecified type     Discharge Instructions     Please try ice on the ankle  Please try the exercises  Please make a food diary. See if the diarrhea is associated with any specific foods.  Please follow up with sports medicine.  Please follow up if your symptoms fail to improve.     ED Prescriptions    None     PDMP not reviewed this encounter.   Rosemarie Ax, MD 08/31/19 [phone]

## 2019-08-31 NOTE — ED Triage Notes (Addendum)
Pt is here with on & off right ankle pain started 2 years ago and abdominal pain that started 3 months ago only hurts in the mornings, pt has taken muscle relaxer's(prescribed to his mother)to relieve discomfort. Pt states he just has never been to the doctor for it. Pt seems to be intoxicated and smells like liquor and pt was falling alseep and slurring his words.

## 2019-08-31 NOTE — Discharge Instructions (Signed)
Please try ice on the ankle  Please try the exercises  Please make a food diary. See if the diarrhea is associated with any specific foods.  Please follow up with sports medicine.  Please follow up if your symptoms fail to improve.

## 2019-09-03 ENCOUNTER — Emergency Department (HOSPITAL_COMMUNITY): Payer: Self-pay

## 2019-09-03 ENCOUNTER — Emergency Department (HOSPITAL_COMMUNITY)
Admission: EM | Admit: 2019-09-03 | Discharge: 2019-09-03 | Disposition: A | Discharge status: Home / Self Care | Payer: Self-pay | Attending: Emergency Medicine | Admitting: Emergency Medicine

## 2019-09-03 ENCOUNTER — Other Ambulatory Visit: Payer: Self-pay

## 2019-09-03 DIAGNOSIS — F17210 Nicotine dependence, cigarettes, uncomplicated: Secondary | ICD-10-CM | POA: Insufficient documentation

## 2019-09-03 DIAGNOSIS — M25571 Pain in right ankle and joints of right foot: Secondary | ICD-10-CM | POA: Insufficient documentation

## 2019-09-03 DIAGNOSIS — G8929 Other chronic pain: Secondary | ICD-10-CM | POA: Insufficient documentation

## 2019-09-03 DIAGNOSIS — R1013 Epigastric pain: Secondary | ICD-10-CM | POA: Insufficient documentation

## 2019-09-03 LAB — COMPREHENSIVE METABOLIC PANEL
ALT: 44 U/L (ref 0–44)
AST: 45 U/L — ABNORMAL HIGH (ref 15–41)
Albumin: 3.9 g/dL (ref 3.5–5.0)
Alkaline Phosphatase: 42 U/L (ref 38–126)
Anion gap: 10 (ref 5–15)
BUN: 9 mg/dL (ref 6–20)
CO2: 21 mmol/L — ABNORMAL LOW (ref 22–32)
Calcium: 8.9 mg/dL (ref 8.9–10.3)
Chloride: 106 mmol/L (ref 98–111)
Creatinine, Ser: 1.19 mg/dL (ref 0.61–1.24)
GFR calc Af Amer: >60 mL/min (ref >60)
GFR calc non Af Amer: >60 mL/min (ref >60)
Glucose, Bld: 103 mg/dL — ABNORMAL HIGH (ref 70–99)
Potassium: 4 mmol/L (ref 3.5–5.1)
Sodium: 137 mmol/L (ref 135–145)
Total Bilirubin: 0.5 mg/dL (ref 0.3–1.2)
Total Protein: 6.3 g/dL — ABNORMAL LOW (ref 6.5–8.1)

## 2019-09-03 LAB — LIPASE, BLOOD: Lipase: 32 U/L (ref 11–51)

## 2019-09-03 LAB — CBC
HCT: 44.1 % (ref 39.0–52.0)
Hemoglobin: 14.8 g/dL (ref 13.0–17.0)
MCH: 32.8 pg (ref 26.0–34.0)
MCHC: 33.6 g/dL (ref 30.0–36.0)
MCV: 97.8 fL (ref 80.0–100.0)
Platelets: 283 10*3/uL (ref 150–400)
RBC: 4.51 MIL/uL (ref 4.22–5.81)
RDW: 12.4 % (ref 11.5–15.5)
WBC: 5.2 10*3/uL (ref 4.0–10.5)
nRBC: 0 % (ref 0.0–0.2)

## 2019-09-03 MED ORDER — SODIUM CHLORIDE 0.9% FLUSH: 3.0000 mL | Freq: Once | INTRAVENOUS | Status: DC

## 2019-09-03 MED ORDER — FAMOTIDINE 20 MG PO TABS
20.0000 mg | ORAL_TABLET | Freq: Two times a day (BID) | ORAL | 0 refills | Status: DC
Start: 2019-09-03 — End: 2021-07-03

## 2019-09-03 NOTE — Discharge Instructions (Addendum)
You can take Tylenol or Ibuprofen as directed for pain. You can alternate Tylenol and Ibuprofen every 4 hours. If you take Tylenol at 1pm, then you can take Ibuprofen at 5pm. Then you can take Tylenol again at 9pm.   Follow the RICE (Rest, Ice, Compression, Elevation) protocol as directed.   Wear splint for support and stabilization.  As we discussed, I have started you on Pepcid that you can take.  If you continue to have symptoms, I have given you a GI doctor to follow-up with.  Follow-up with [facility] to establish a primary care doctor if you do not have one.   Return to the Emergency Department immediately if you experience any worsening abdominal pain, fever, persistent nausea and vomiting, inability keep any food down, pain with urination, blood in your urine or any other worsening or concerning symptoms.  Return to the emergency department for any worsening ankle pain, numbness/weakness, warmth, erythema or any other worsening or concerning symptoms.

## 2019-09-03 NOTE — ED Triage Notes (Signed)
Pt here for eval of R ankle pain, ongoing x 1 year but worse since yesterday. Endorses injury one year ago but did not get it checked then. Also reports abdominal pain every other morning with diarrhea and nausea when he wakes up.

## 2019-09-03 NOTE — ED Provider Notes (Signed)
[facility] EMERGENCY DEPARTMENT Provider Note   CSN: [id number] Arrival date & time: 09/03/19  1037     History Chief Complaint  Patient presents with  . Ankle Pain  . Abdominal Pain    Connor Brennan is a 29 y.o. male who presents for evaluation of right ankle pain x1 year and intermittent abdominal pain x2 months.  Reports about a year ago, he had an injury to his right ankle.  He states he never got it evaluated at that time.  Since then, he has had intermittent pain to the ankle that flares up every now and then.  He was seen on 08/31/2019 at urgent care and was diagnosed with pes planus.  He states that since then, he has continued to have pain and felt like yesterday, got worse.  He reports that most the pain is in the medial malleolus of the right ankle that extends into the right foot.  He states it is worse when he walks on it or moves it.  He has not taken anything for the pain.  Denies any numbness/weakness.  No new trauma, injury, fall.  He also reports that over the last 2 months, he has had intermittent epigastric abdominal pain he states it is worse when he gets up.  He states that after a while, the pain goes away.  He had one episode of vomiting 2 months ago but since then has not had any since.  He states that he still been able to eat and drink without any difficulty.  He occasionally has watery diarrhea.  No blood in stools.  He has not taken medications for his symptoms.  He states that this does not occur every day and is rather intermittent.  He states that sometimes when he wakes up, he has the pain and when he eats, it makes it feel better.  He does occasionally report some burning sensation to the epigastric region.  He denies any fevers, chest pain, difficulty breathing, urinary complaints, blood in stool, numbness/weakness.  The history is provided by the patient.       Past Medical History:  Diagnosis Date  . Asthma     There are no problems  to display for this patient.   Past Surgical History:  Procedure Laterality Date  . HAND SURGERY Right        Family History  Problem Relation Age of Onset  . Healthy Mother   . Healthy Father     Social History   Tobacco Use  . Smoking status: Current Every Day Smoker    Packs/day: 0.50    Types: Cigarettes  . Smokeless tobacco: Never Used  Substance Use Topics  . Alcohol use: Yes    Comment: social  . Drug use: Yes    Types: Marijuana    Home Medications Prior to Admission medications   Medication Sig Start Date End Date Taking? Authorizing Provider  cephALEXin (KEFLEX) 500 MG capsule Take 2 capsules (1,000 mg total) by mouth 2 (two) times daily. 09/27/17   Charlann Lange, PA-C  famotidine (PEPCID) 20 MG tablet Take 1 tablet (20 mg total) by mouth 2 (two) times daily. 09/03/19   Volanda Napoleon, PA-C  ibuprofen (ADVIL,MOTRIN) 600 MG tablet Take 1 tablet (600 mg total) by mouth every 6 (six) hours as needed. 09/27/17   Charlann Lange, PA-C  predniSONE (STERAPRED UNI-PAK 21 TAB) 10 MG (21) TBPK tablet Take by mouth daily. Take as directed. 11/06/18   Vanessa Kick, MD  Allergies    Eggs or egg-derived products and Shellfish allergy  Review of Systems   Review of Systems  Constitutional: Negative for fever.  Respiratory: Negative for cough and shortness of breath.   Cardiovascular: Negative for chest pain.  Gastrointestinal: Positive for abdominal pain (Chronic) and diarrhea. Negative for nausea and vomiting.  Genitourinary: Negative for dysuria and hematuria.  Musculoskeletal:       Right ankle pain  Neurological: Negative for headaches.  All other systems reviewed and are negative.   Physical Exam Updated Vital Signs BP 127/86   Pulse 67   Temp 98.5 F (36.9 C) (Oral)   Resp 14   Ht 6' (1.829 m)   Wt 83.5 kg   SpO2 100%   BMI 24.95 kg/m   Physical Exam Vitals and nursing note reviewed.  Constitutional:      Appearance: Normal appearance. He is  well-developed.  HENT:     Head: Normocephalic and atraumatic.  Eyes:     General: Lids are normal.     Conjunctiva/sclera: Conjunctivae normal.     Pupils: Pupils are equal, round, and reactive to light.  Cardiovascular:     Rate and Rhythm: Normal rate and regular rhythm.     Pulses: Normal pulses.          Radial pulses are 2+ on the right side and 2+ on the left side.       Dorsalis pedis pulses are 2+ on the right side and 2+ on the left side.     Heart sounds: Normal heart sounds. No murmur heard.  No friction rub. No gallop.   Pulmonary:     Effort: Pulmonary effort is normal.     Breath sounds: Normal breath sounds.     Comments: Lungs clear to auscultation bilaterally.  Symmetric chest rise.  No wheezing, rales, rhonchi. Abdominal:     Palpations: Abdomen is soft. Abdomen is not rigid.     Tenderness: There is no abdominal tenderness. There is no guarding.     Comments: Abdomen is soft, non-distended, non-tender. No rigidity, No guarding. No peritoneal signs. No CVA tenderness noted bilatearlly.   Musculoskeletal:        General: Normal range of motion.     Cervical back: Full passive range of motion without pain.     Comments: Tenderness palpation of the medial malleolus of right ankle with no overlying soft tissue swelling, erythema, edema.  Dorsiflexion and plantar flexion intact with any difficulty.  He can wiggle all 5 toes on right foot with any difficulty.  No bony tenderness noted to distal, proximal tib-fib, knee, femur.  No tenderness palpation of the left lower extremity.  Skin:    General: Skin is warm and dry.     Capillary Refill: Capillary refill takes less than 2 seconds.     Comments: Good distal cap refill.  RLE is not dusky in appearance or cool to touch.  Neurological:     Mental Status: He is alert and oriented to person, place, and time.     Comments: Sensation intact along major nerve distributions of BLE  Psychiatric:        Speech: Speech normal.      ED Results / Procedures / Treatments   Labs (all labs ordered are listed, but only abnormal results are displayed) Labs Reviewed  COMPREHENSIVE METABOLIC PANEL - Abnormal; Notable for the following components:      Result Value   CO2 21 (*)    Glucose, Bld  103 (*)    Total Protein 6.3 (*)    AST 45 (*)    All other components within normal limits  LIPASE, BLOOD  CBC    EKG None  Radiology DG Ankle Complete Right  Result Date: 09/03/2019 CLINICAL DATA:  Right ankle pain after fall last year. EXAM: RIGHT ANKLE - COMPLETE 3+ VIEW COMPARISON:  None. FINDINGS: There is no evidence of fracture, dislocation, or joint effusion. There is no evidence of arthropathy or other focal bone abnormality. Soft tissues are unremarkable. IMPRESSION: Negative. Electronically Signed   By: Lupita Raider M.D.   On: 09/03/2019 11:45    Procedures Procedures (including critical care time)  Medications Ordered in ED Medications  sodium chloride flush (NS) 0.9 % injection 3 mL (has no administration in time range)    ED Course  I have reviewed the triage vital signs and the nursing notes.  Pertinent labs & imaging results that were available during my care of the patient were reviewed by me and considered in my medical decision making (see chart for details).    MDM Rules/Calculators/A&P                          29 year old male who presents for evaluation of right ankle pain x1 year and intermittent abdominal pain x2 months.  Seen at urgent care in 08/31/2019 for evaluation of symptoms.  Was told he had pes planus.  Comes in today basis because he states his ankle continues to hurt because he did not get an x-ray at urgent care.  No new trauma, injury.  He states the abdominal pain is intermittent and mostly occurs the morning.  On initially arrival, he is afebrile, nontoxic-appearing.  Vital signs are stable.  He is neurovascularly intact.  Abdominal exam is benign with no evidence of  tenderness.  He has tenderness palpation medial Alice of right ankle.  No deformity or crepitus noted.  X-ray, labs ordered at triage.  I suspect this may be more musculoskeletal/ligament/tendon injury rather than acute bony abnormality given duration of symptoms. History/physical exam is not concerning for ischemic limb, septic arthritis, DVT of lower extremity.  Additionally, I discussed with him the urgent care diagnosed him with pes planus.  We discussed at home supportive care measures.  Additionally, his abdomen is benign on today's exam.  He only has pain when waking up and it gets better when he eats.  No fevers.  He has had some diarrhea that has been intermittent.  No blood in stools.  History/physical exam concerning for appendicitis, diverticulitis.  Consider GERD versus PUD.  History/physical exam not concerning for GU etiology. Labs ordered at triage.  Lipase is normal.  CBC shows no leukocytosis or anemia.  CMP shows normal BUN and creatinine.  AST is 45.  LFTs otherwise unremarkable.  X-ray of ankle shows no acute bony abnormality.  Discussed with patient.  We will plan to put him in an ASO splint for support and stabilization.  Encouraged at home supportive care measures.  Additionally, will start patient on Pepcid for treatment of PUD versus GERD.  Patient given outpatient GI referral if he continues to have symptoms.  At this time, given reassuring work-up, benign abdominal exam, do not feel that he needs a CT abdomen pelvis as do not suspect surgical pathology as etiology of his symptoms. At this time, patient exhibits no emergent life-threatening condition that require further evaluation in ED or admission. Patient had ample  opportunity for questions and discussion. All patient's questions were answered with full understanding. .ladlc  Portions of this note were generated with Dragon dictation software. Dictation errors may occur despite best attempts at proofreading.  Final Clinical  Impression(s) / ED Diagnoses Final diagnoses:  Acute right ankle pain  Epigastric pain    Rx / DC Orders ED Discharge Orders         Ordered    famotidine (PEPCID) 20 MG tablet  2 times daily     Discontinue  Reprint     09/03/19 1645           Maxwell Caul, PA-C 09/03/19 1739    Pricilla Loveless, MD 09/04/19 1552

## 2019-09-03 NOTE — Progress Notes (Signed)
Orthopedic Tech Progress Note Patient Details:  Nazeer Romney 1991/03/06 [id number]  Ortho Devices Type of Ortho Device: ASO Ortho Device/Splint Location: Lower Right Extremity Ortho Device/Splint Interventions: Ordered, Application   Post Interventions Patient Tolerated: Well Instructions Provided: Adjustment of device, Care of device, Poper ambulation with device   Alberto Schoch Clarene Reamer 09/03/2019, 5:22 PM

## 2019-09-08 ENCOUNTER — Ambulatory Visit: Payer: Self-pay | Admitting: Orthopedic Surgery

## 2019-09-28 ENCOUNTER — Ambulatory Visit: Payer: Self-pay | Admitting: Orthopedic Surgery

## 2019-10-06 ENCOUNTER — Ambulatory Visit (INDEPENDENT_AMBULATORY_CARE_PROVIDER_SITE_OTHER): Payer: Self-pay | Admitting: Orthopaedic Surgery

## 2019-10-06 ENCOUNTER — Encounter: Payer: Self-pay | Admitting: Orthopaedic Surgery

## 2019-10-06 DIAGNOSIS — M2140 Flat foot [pes planus] (acquired), unspecified foot: Secondary | ICD-10-CM | POA: Insufficient documentation

## 2019-10-06 DIAGNOSIS — M2141 Flat foot [pes planus] (acquired), right foot: Secondary | ICD-10-CM

## 2019-10-06 DIAGNOSIS — M2142 Flat foot [pes planus] (acquired), left foot: Secondary | ICD-10-CM

## 2019-10-06 NOTE — Progress Notes (Signed)
Office Visit Note   Patient: Connor Brennan           Date of Birth: 10-09-1990           MRN: [id number] Visit Date: 10/06/2019              Requested by: No referring provider defined for this encounter. PCP: Patient, No Pcp Per   Assessment & Plan: Visit Diagnoses:  1. Pes planus of both feet     Plan: Previous x-rays of his left foot 1 year ago when he was seen with left foot pain without history of injury and also right ankle demonstrates some dorsal spurring in the midfoot right and left.  Patient has flexible flat feet with pronation with standing and posterior tibial laxity.  We discussed blue super feet inserts and discussed appropriate use.  If he has persistent problems he will let us know.  We discussed smoking cessation to help with his cold intolerance problem.  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  Should have improvement in his ankle and foot symptoms with arch support with his bilateral pes planus.  He can follow-up if he has ongoing problems.  X-rays reviewed and discussed with patient also his mother.  Follow-Up Instructions: No follow-ups on file.   Orders:  No orders of the defined types were placed in this encounter.  No orders of the defined types were placed in this encounter.     Procedures: No procedures performed   Clinical Data: No additional findings.   Subjective: Chief Complaint  Patient presents with  . Right Ankle - Pain  . Left Arm - Pain    HPI 29 year old male here with his mother is a patient of mine with complaints of bilateral problems with his feet and ankles most recent right ankle pain.  Pain has been present for more than a year.  He also had problems with his left arm with a laceration transverse upper arm.  He states it was when he was pushing glass door open glass shattered.  He states he has problems with cold intolerance in the hand thinks he has some numbness in the entire hand.  Patient is  right-hand dominant.  Lacerations were in the proximal portion of the antecubital were transverse.  Arm laceration was 09/26/2017 close emergency room.  Review of Systems positive smoking left hand cold intolerance chronic ankle and foot pain right greater than left.   Objective: Vital Signs: BP 120/72   Pulse 81   Ht 6\' 1"  (1.854 m)   Wt 180 lb (81.6 kg)   BMI 23.75 kg/m   Physical Exam Constitutional:      Appearance: He is well-developed.  HENT:     Head: Normocephalic and atraumatic.  Eyes:     Pupils: Pupils are equal, round, and reactive to light.  Neck:     Thyroid: No thyromegaly.     Trachea: No tracheal deviation.  Cardiovascular:     Rate and Rhythm: Normal rate.  Pulmonary:     Effort: Pulmonary effort is normal.     Breath sounds: No wheezing.  Abdominal:     General: Bowel sounds are normal.     Palpations: Abdomen is soft.  Skin:    General: Skin is warm and dry.     Capillary Refill: Capillary refill takes less than 2 seconds.  Neurological:     Mental Status: He is alert and oriented to person, place, and time.  Psychiatric:  Behavior: Behavior normal.        Thought Content: Thought content normal.        Judgment: Judgment normal.     Ortho Exam patient has some tenderness over posterior tibial tendon.  Normal arch in sitting position with flexible flat feet.  Pronation was standing some tenderness over the dorsal midfoot right and left.  Subtalar motion is intact.  Left hand hypothenar and thenar muscles are active no atrophy.  Normal Allen's test at the wrist normal radial and ulnar pulse.  Radial sensory is intact.  Normal profundus flexors and extensors.  Specialty Comments:  No specialty comments available.  Imaging: No results found.   PMFS History: Patient Active Problem List   Diagnosis Date Noted  . Pes planus, flexible 10/06/2019   Past Medical History:  Diagnosis Date  . Asthma     Family History  Problem Relation Age  of Onset  . Healthy Mother   . Healthy Father     Past Surgical History:  Procedure Laterality Date  . HAND SURGERY Right    Social History   Occupational History  . Not on file  Tobacco Use  . Smoking status: Current Every Day Smoker    Packs/day: 0.50    Types: Cigarettes  . Smokeless tobacco: Never Used  Substance and Sexual Activity  . Alcohol use: Yes    Comment: social  . Drug use: Yes    Types: Marijuana  . Sexual activity: Yes    Birth control/protection: None    Comment: Married

## 2019-10-28 ENCOUNTER — Emergency Department (HOSPITAL_COMMUNITY): Payer: Self-pay

## 2019-10-28 ENCOUNTER — Encounter (HOSPITAL_COMMUNITY): Payer: Self-pay

## 2019-10-28 ENCOUNTER — Encounter (HOSPITAL_COMMUNITY)
Admission: EM | Disposition: A | Discharge status: Home / Self Care | Payer: Self-pay | Source: Home / Self Care | Attending: Emergency Medicine

## 2019-10-28 ENCOUNTER — Emergency Department (HOSPITAL_COMMUNITY): Payer: Self-pay | Admitting: Certified Registered Nurse Anesthetist

## 2019-10-28 ENCOUNTER — Observation Stay (HOSPITAL_COMMUNITY)
Admission: EM | Admit: 2019-10-28 | Discharge: 2019-10-29 | Disposition: A | Discharge status: Home / Self Care | Payer: Self-pay | Attending: Vascular Surgery | Admitting: Vascular Surgery

## 2019-10-28 DIAGNOSIS — Y998 Other external cause status: Secondary | ICD-10-CM | POA: Insufficient documentation

## 2019-10-28 DIAGNOSIS — S41111A Laceration without foreign body of right upper arm, initial encounter: Secondary | ICD-10-CM | POA: Diagnosis present

## 2019-10-28 DIAGNOSIS — F17210 Nicotine dependence, cigarettes, uncomplicated: Secondary | ICD-10-CM | POA: Insufficient documentation

## 2019-10-28 DIAGNOSIS — S56821A Laceration of other muscles, fascia and tendons at forearm level, right arm, initial encounter: Principal | ICD-10-CM | POA: Insufficient documentation

## 2019-10-28 DIAGNOSIS — W25XXXA Contact with sharp glass, initial encounter: Secondary | ICD-10-CM | POA: Insufficient documentation

## 2019-10-28 DIAGNOSIS — S55191A Other specified injury of radial artery at forearm level, right arm, initial encounter: Secondary | ICD-10-CM

## 2019-10-28 DIAGNOSIS — Y9389 Activity, other specified: Secondary | ICD-10-CM | POA: Insufficient documentation

## 2019-10-28 DIAGNOSIS — Z20822 Contact with and (suspected) exposure to covid-19: Secondary | ICD-10-CM | POA: Insufficient documentation

## 2019-10-28 DIAGNOSIS — Y9289 Other specified places as the place of occurrence of the external cause: Secondary | ICD-10-CM | POA: Insufficient documentation

## 2019-10-28 HISTORY — PX: AV FISTULA PLACEMENT: SHX1204

## 2019-10-28 HISTORY — PX: TENDON REPAIR: SHX5111

## 2019-10-28 LAB — CBC WITH DIFFERENTIAL/PLATELET
Abs Immature Granulocytes: 0.03 10*3/uL (ref 0.00–0.07)
Basophils Absolute: 0.1 10*3/uL (ref 0.0–0.1)
Basophils Relative: 1 %
Eosinophils Absolute: 0.1 10*3/uL (ref 0.0–0.5)
Eosinophils Relative: 1 %
HCT: 42.2 % (ref 39.0–52.0)
Hemoglobin: 14.1 g/dL (ref 13.0–17.0)
Immature Granulocytes: 0 %
Lymphocytes Relative: 34 %
Lymphs Abs: 2.5 10*3/uL (ref 0.7–4.0)
MCH: 31.8 pg (ref 26.0–34.0)
MCHC: 33.4 g/dL (ref 30.0–36.0)
MCV: 95.3 fL (ref 80.0–100.0)
Monocytes Absolute: 0.5 10*3/uL (ref 0.1–1.0)
Monocytes Relative: 6 %
Neutro Abs: 4.4 10*3/uL (ref 1.7–7.7)
Neutrophils Relative %: 58 %
Platelets: 314 10*3/uL (ref 150–400)
RBC: 4.43 MIL/uL (ref 4.22–5.81)
RDW: 13.6 % (ref 11.5–15.5)
WBC: 7.6 10*3/uL (ref 4.0–10.5)
nRBC: 0 % (ref 0.0–0.2)

## 2019-10-28 LAB — TYPE AND SCREEN
ABO/RH(D): O POS
Antibody Screen: NEGATIVE

## 2019-10-28 LAB — BASIC METABOLIC PANEL
Anion gap: 9 (ref 5–15)
BUN: 6 mg/dL (ref 6–20)
CO2: 22 mmol/L (ref 22–32)
Calcium: 8.9 mg/dL (ref 8.9–10.3)
Chloride: 110 mmol/L (ref 98–111)
Creatinine, Ser: 1.19 mg/dL (ref 0.61–1.24)
GFR calc Af Amer: >60 mL/min (ref >60)
GFR calc non Af Amer: >60 mL/min (ref >60)
Glucose, Bld: 116 mg/dL — ABNORMAL HIGH (ref 70–99)
Potassium: 4.9 mmol/L (ref 3.5–5.1)
Sodium: 141 mmol/L (ref 135–145)

## 2019-10-28 LAB — SARS CORONAVIRUS 2 BY RT PCR (HOSPITAL ORDER, PERFORMED IN ~~LOC~~ HOSPITAL LAB): SARS Coronavirus 2: NEGATIVE

## 2019-10-28 SURGERY — ARTERIOVENOUS (AV) FISTULA CREATION
Anesthesia: General | Site: Arm Lower | Laterality: Right

## 2019-10-28 MED ORDER — SODIUM CHLORIDE 0.9 % IV SOLN
INTRAVENOUS | Status: AC
  Filled 2019-10-28: qty 1.2

## 2019-10-28 MED ORDER — HYDRALAZINE HCL 20 MG/ML IJ SOLN: 5.0000 mg | INTRAMUSCULAR | Status: DC | PRN

## 2019-10-28 MED ORDER — PROPOFOL 10 MG/ML IV BOLUS
INTRAVENOUS | Status: AC
  Filled 2019-10-28: qty 20

## 2019-10-28 MED ORDER — CEFAZOLIN SODIUM-DEXTROSE 2-3 GM-%(50ML) IV SOLR
INTRAVENOUS | Status: DC | PRN
Start: 2019-10-28 — End: 2019-10-28
  Administered 2019-10-28: 2 g via INTRAVENOUS

## 2019-10-28 MED ORDER — MIDAZOLAM HCL 2 MG/2ML IJ SOLN
INTRAMUSCULAR | Status: DC | PRN
  Administered 2019-10-28: 2 mg via INTRAVENOUS

## 2019-10-28 MED ORDER — PHENYLEPHRINE 40 MCG/ML (10ML) SYRINGE FOR IV PUSH (FOR BLOOD PRESSURE SUPPORT)
PREFILLED_SYRINGE | INTRAVENOUS | Status: AC
  Filled 2019-10-28: qty 10

## 2019-10-28 MED ORDER — OXYCODONE HCL 5 MG PO TABS: 5.0000 mg | ORAL_TABLET | Freq: Once | ORAL | Status: DC | PRN

## 2019-10-28 MED ORDER — PANTOPRAZOLE SODIUM 40 MG PO TBEC
40.0000 mg | DELAYED_RELEASE_TABLET | Freq: Every day | ORAL | Status: DC
  Administered 2019-10-29: 40 mg via ORAL
  Filled 2019-10-28: qty 1

## 2019-10-28 MED ORDER — LABETALOL HCL 5 MG/ML IV SOLN: 10.0000 mg | INTRAVENOUS | Status: DC | PRN

## 2019-10-28 MED ORDER — DEXAMETHASONE SODIUM PHOSPHATE 10 MG/ML IJ SOLN
INTRAMUSCULAR | Status: DC | PRN
  Administered 2019-10-28: 5 mg via INTRAVENOUS

## 2019-10-28 MED ORDER — GUAIFENESIN-DM 100-10 MG/5ML PO SYRP: 15.0000 mL | ORAL_SOLUTION | ORAL | Status: DC | PRN

## 2019-10-28 MED ORDER — PHENOL 1.4 % MT LIQD: 1.0000 | OROMUCOSAL | Status: DC | PRN

## 2019-10-28 MED ORDER — ROCURONIUM BROMIDE 10 MG/ML (PF) SYRINGE
PREFILLED_SYRINGE | INTRAVENOUS | Status: AC
  Filled 2019-10-28: qty 10

## 2019-10-28 MED ORDER — CEFAZOLIN SODIUM 1 G IJ SOLR
INTRAMUSCULAR | Status: AC
  Filled 2019-10-28: qty 20

## 2019-10-28 MED ORDER — SUCCINYLCHOLINE CHLORIDE 20 MG/ML IJ SOLN
INTRAMUSCULAR | Status: DC | PRN
  Administered 2019-10-28: 120 mg via INTRAVENOUS

## 2019-10-28 MED ORDER — ONDANSETRON HCL 4 MG/2ML IJ SOLN: 4.0000 mg | Freq: Four times a day (QID) | INTRAMUSCULAR | Status: DC | PRN

## 2019-10-28 MED ORDER — FENTANYL CITRATE (PF) 100 MCG/2ML IJ SOLN
INTRAMUSCULAR | Status: AC
  Filled 2019-10-28: qty 2

## 2019-10-28 MED ORDER — OXYCODONE-ACETAMINOPHEN 5-325 MG PO TABS
1.0000 | ORAL_TABLET | ORAL | Status: DC | PRN
  Administered 2019-10-28 – 2019-10-29 (×2): 2 via ORAL
  Filled 2019-10-28 (×2): qty 2

## 2019-10-28 MED ORDER — PROPOFOL 10 MG/ML IV BOLUS
INTRAVENOUS | Status: DC | PRN
  Administered 2019-10-28: 200 mg via INTRAVENOUS

## 2019-10-28 MED ORDER — FENTANYL CITRATE (PF) 100 MCG/2ML IJ SOLN
INTRAMUSCULAR | Status: DC | PRN
  Administered 2019-10-28 (×3): 50 ug via INTRAVENOUS

## 2019-10-28 MED ORDER — PHENYLEPHRINE 40 MCG/ML (10ML) SYRINGE FOR IV PUSH (FOR BLOOD PRESSURE SUPPORT)
PREFILLED_SYRINGE | INTRAVENOUS | Status: DC | PRN
  Administered 2019-10-28: 80 ug via INTRAVENOUS
  Administered 2019-10-28 (×2): 120 ug via INTRAVENOUS
  Administered 2019-10-28: 80 ug via INTRAVENOUS

## 2019-10-28 MED ORDER — LIDOCAINE 2% (20 MG/ML) 5 ML SYRINGE
INTRAMUSCULAR | Status: AC
  Filled 2019-10-28: qty 5

## 2019-10-28 MED ORDER — LIDOCAINE-EPINEPHRINE 0.5 %-1:200000 IJ SOLN
INTRAMUSCULAR | Status: AC
  Filled 2019-10-28: qty 1

## 2019-10-28 MED ORDER — ONDANSETRON HCL 4 MG/2ML IJ SOLN
INTRAMUSCULAR | Status: DC | PRN
  Administered 2019-10-28: 4 mg via INTRAVENOUS

## 2019-10-28 MED ORDER — SUGAMMADEX SODIUM 200 MG/2ML IV SOLN
INTRAVENOUS | Status: DC | PRN
  Administered 2019-10-28: 200 mg via INTRAVENOUS

## 2019-10-28 MED ORDER — SUCCINYLCHOLINE CHLORIDE 200 MG/10ML IV SOSY
PREFILLED_SYRINGE | INTRAVENOUS | Status: AC
  Filled 2019-10-28: qty 10

## 2019-10-28 MED ORDER — DEXAMETHASONE SODIUM PHOSPHATE 10 MG/ML IJ SOLN
INTRAMUSCULAR | Status: AC
  Filled 2019-10-28: qty 1

## 2019-10-28 MED ORDER — ONDANSETRON HCL 4 MG/2ML IJ SOLN
INTRAMUSCULAR | Status: AC
  Filled 2019-10-28: qty 2

## 2019-10-28 MED ORDER — MORPHINE SULFATE (PF) 2 MG/ML IV SOLN: 2.0000 mg | INTRAVENOUS | Status: DC | PRN

## 2019-10-28 MED ORDER — FENTANYL CITRATE (PF) 250 MCG/5ML IJ SOLN
INTRAMUSCULAR | Status: AC
  Filled 2019-10-28: qty 5

## 2019-10-28 MED ORDER — ALUM & MAG HYDROXIDE-SIMETH 200-200-20 MG/5ML PO SUSP: 15.0000 mL | ORAL | Status: DC | PRN

## 2019-10-28 MED ORDER — SODIUM CHLORIDE 0.9 % IV SOLN
INTRAVENOUS | Status: DC | PRN
  Administered 2019-10-28: 500 mL

## 2019-10-28 MED ORDER — PHENYLEPHRINE HCL-NACL 10-0.9 MG/250ML-% IV SOLN
INTRAVENOUS | Status: DC | PRN
  Administered 2019-10-28: 25 ug/min via INTRAVENOUS

## 2019-10-28 MED ORDER — 0.9 % SODIUM CHLORIDE (POUR BTL) OPTIME
TOPICAL | Status: DC | PRN
  Administered 2019-10-28: 1000 mL

## 2019-10-28 MED ORDER — LIDOCAINE 2% (20 MG/ML) 5 ML SYRINGE
INTRAMUSCULAR | Status: DC | PRN
  Administered 2019-10-28: 60 mg via INTRAVENOUS

## 2019-10-28 MED ORDER — FENTANYL CITRATE (PF) 100 MCG/2ML IJ SOLN
25.0000 ug | INTRAMUSCULAR | Status: DC | PRN
  Administered 2019-10-28 (×2): 50 ug via INTRAVENOUS

## 2019-10-28 MED ORDER — SODIUM CHLORIDE 0.9 % IV BOLUS
1000.0000 mL | Freq: Once | INTRAVENOUS | Status: AC
  Administered 2019-10-28: 1000 mL via INTRAVENOUS

## 2019-10-28 MED ORDER — MIDAZOLAM HCL 2 MG/2ML IJ SOLN
INTRAMUSCULAR | Status: AC
  Filled 2019-10-28: qty 2

## 2019-10-28 MED ORDER — METOPROLOL TARTRATE 5 MG/5ML IV SOLN: 2.0000 mg | INTRAVENOUS | Status: DC | PRN

## 2019-10-28 MED ORDER — ROCURONIUM BROMIDE 10 MG/ML (PF) SYRINGE
PREFILLED_SYRINGE | INTRAVENOUS | Status: DC | PRN
  Administered 2019-10-28: 50 mg via INTRAVENOUS

## 2019-10-28 MED ORDER — PROMETHAZINE HCL 25 MG/ML IJ SOLN: 6.2500 mg | INTRAMUSCULAR | Status: DC | PRN

## 2019-10-28 MED ORDER — LACTATED RINGERS IV SOLN
INTRAVENOUS | Status: DC | PRN
Start: 2019-10-28 — End: 2019-10-28

## 2019-10-28 MED ORDER — FENTANYL CITRATE (PF) 100 MCG/2ML IJ SOLN
50.0000 ug | Freq: Once | INTRAMUSCULAR | Status: AC
  Administered 2019-10-28: 50 ug via INTRAVENOUS
  Filled 2019-10-28: qty 2

## 2019-10-28 MED ORDER — OXYCODONE HCL 5 MG/5ML PO SOLN: 5.0000 mg | Freq: Once | ORAL | Status: DC | PRN

## 2019-10-28 MED ORDER — ALBUMIN HUMAN 5 % IV SOLN
INTRAVENOUS | Status: DC | PRN
Start: 2019-10-28 — End: 2019-10-28

## 2019-10-28 MED ORDER — LACTATED RINGERS IV SOLN: INTRAVENOUS | Status: DC | PRN

## 2019-10-28 MED ORDER — HYDROMORPHONE HCL 1 MG/ML IJ SOLN
1.0000 mg | Freq: Once | INTRAMUSCULAR | Status: AC
  Administered 2019-10-28: 1 mg via INTRAVENOUS
  Filled 2019-10-28: qty 1

## 2019-10-28 SURGICAL SUPPLY — 53 items
ARMBAND PINK RESTRICT EXTREMIT (MISCELLANEOUS) ×6 IMPLANT
BNDG ELASTIC 3X5.8 VLCR STR LF (GAUZE/BANDAGES/DRESSINGS) ×6 IMPLANT
BNDG GAUZE ELAST 4 BULKY (GAUZE/BANDAGES/DRESSINGS) ×3 IMPLANT
CANISTER SUCT 3000ML PPV (MISCELLANEOUS) ×3 IMPLANT
CANNULA VESSEL 3MM 2 BLNT TIP (CANNULA) ×3 IMPLANT
CLIP LIGATING EXTRA MED SLVR (CLIP) ×3 IMPLANT
CLIP LIGATING EXTRA SM BLUE (MISCELLANEOUS) ×3 IMPLANT
COVER PROBE W GEL 5X96 (DRAPES) ×3 IMPLANT
COVER WAND RF STERILE (DRAPES) ×3 IMPLANT
DECANTER SPIKE VIAL GLASS SM (MISCELLANEOUS) ×3 IMPLANT
DERMABOND ADVANCED (GAUZE/BANDAGES/DRESSINGS) ×2
DERMABOND ADVANCED .7 DNX12 (GAUZE/BANDAGES/DRESSINGS) ×1 IMPLANT
DRSG ADAPTIC 3X8 NADH LF (GAUZE/BANDAGES/DRESSINGS) ×3 IMPLANT
ELECT REM PT RETURN 9FT ADLT (ELECTROSURGICAL) ×3
ELECTRODE REM PT RTRN 9FT ADLT (ELECTROSURGICAL) ×1 IMPLANT
GLOVE BIOGEL PI IND STRL 7.0 (GLOVE) ×1 IMPLANT
GLOVE BIOGEL PI IND STRL 7.5 (GLOVE) ×1 IMPLANT
GLOVE BIOGEL PI IND STRL 8.5 (GLOVE) ×1 IMPLANT
GLOVE BIOGEL PI INDICATOR 7.0 (GLOVE) ×2
GLOVE BIOGEL PI INDICATOR 7.5 (GLOVE) ×2
GLOVE BIOGEL PI INDICATOR 8.5 (GLOVE) ×2
GLOVE SS BIOGEL STRL SZ 7.5 (GLOVE) ×1 IMPLANT
GLOVE SUPERSENSE BIOGEL SZ 7.5 (GLOVE) ×2
GLOVE SURG ORTHO 8.0 STRL STRW (GLOVE) ×3 IMPLANT
GLOVE SURG SS PI 6.5 STRL IVOR (GLOVE) ×3 IMPLANT
GLOVE SURG SS PI 7.5 STRL IVOR (GLOVE) ×3 IMPLANT
GOWN STRL REUS W/ TWL LRG LVL3 (GOWN DISPOSABLE) ×3 IMPLANT
GOWN STRL REUS W/TWL LRG LVL3 (GOWN DISPOSABLE) ×9
KIT BASIN OR (CUSTOM PROCEDURE TRAY) ×3 IMPLANT
KIT TURNOVER KIT B (KITS) ×3 IMPLANT
NS IRRIG 1000ML POUR BTL (IV SOLUTION) ×3 IMPLANT
PACK CV ACCESS (CUSTOM PROCEDURE TRAY) ×3 IMPLANT
PAD ARMBOARD 7.5X6 YLW CONV (MISCELLANEOUS) ×6 IMPLANT
PAD CAST 3X4 CTTN HI CHSV (CAST SUPPLIES) ×1 IMPLANT
PADDING CAST COTTON 3X4 STRL (CAST SUPPLIES) ×3
PADDING UNDERCAST 2 STRL (CAST SUPPLIES) ×2
PADDING UNDERCAST 2X4 STRL (CAST SUPPLIES) ×1 IMPLANT
SPLINT FIBERGLASS 3X35 (CAST SUPPLIES) ×3 IMPLANT
SUT PROLENE 3 0 PS 1 (SUTURE) ×9 IMPLANT
SUT PROLENE 5 0 C 1 24 (SUTURE) ×3 IMPLANT
SUT PROLENE 6 0 CC (SUTURE) ×3 IMPLANT
SUT VIC AB 0 CT1 27 (SUTURE) ×3
SUT VIC AB 0 CT1 27XBRD ANBCTR (SUTURE) ×1 IMPLANT
SUT VIC AB 2-0 CT1 36 (SUTURE) ×3 IMPLANT
SUT VIC AB 2-0 SH 27 (SUTURE) ×3
SUT VIC AB 2-0 SH 27XBRD (SUTURE) ×1 IMPLANT
SUT VIC AB 3-0 SH 27 (SUTURE) ×3
SUT VIC AB 3-0 SH 27X BRD (SUTURE) ×1 IMPLANT
SYR TOOMEY IRRIG 70ML (MISCELLANEOUS) ×3
SYRINGE TOOMEY IRRIG 70ML (MISCELLANEOUS) ×1 IMPLANT
TOWEL GREEN STERILE (TOWEL DISPOSABLE) ×3 IMPLANT
UNDERPAD 30X36 HEAVY ABSORB (UNDERPADS AND DIAPERS) ×3 IMPLANT
WATER STERILE IRR 1000ML POUR (IV SOLUTION) ×3 IMPLANT

## 2019-10-28 NOTE — ED Provider Notes (Signed)
[facility] EMERGENCY DEPARTMENT Provider Note   CSN: [id number] Arrival date & time: 10/28/19  1849     History Chief Complaint  Patient presents with  . Extremity Laceration    Connor Brennan is a 29 y.o. male who presents to ED with a chief complaint of right arm laceration.  States that this occurred just prior to arrival.  He was walking on the steps at his sister's house when he lost his balance and his arm went through the window.  He denies any numbness, weakness or anticoagulant use.  Believes that last tetanus was within the last 5 years.  HPI     Past Medical History:  Diagnosis Date  . Asthma     Patient Active Problem List   Diagnosis Date Noted  . Pes planus, flexible 10/06/2019    Past Surgical History:  Procedure Laterality Date  . HAND SURGERY Right        Family History  Problem Relation Age of Onset  . Healthy Mother   . Healthy Father     Social History   Tobacco Use  . Smoking status: Current Every Day Smoker    Packs/day: 0.50    Types: Cigarettes  . Smokeless tobacco: Never Used  Substance Use Topics  . Alcohol use: Yes    Comment: social  . Drug use: Yes    Types: Marijuana    Home Medications Prior to Admission medications   Medication Sig Start Date End Date Taking? Authorizing Provider  cephALEXin (KEFLEX) 500 MG capsule Take 2 capsules (1,000 mg total) by mouth 2 (two) times daily. Patient not taking: Reported on 10/28/2019 09/27/17   Elpidio Anis, PA-C  famotidine (PEPCID) 20 MG tablet Take 1 tablet (20 mg total) by mouth 2 (two) times daily. Patient not taking: Reported on 10/28/2019 09/03/19   Maxwell Caul, PA-C  ibuprofen (ADVIL,MOTRIN) 600 MG tablet Take 1 tablet (600 mg total) by mouth every 6 (six) hours as needed. Patient not taking: Reported on 10/28/2019 09/27/17   Elpidio Anis, PA-C  predniSONE (STERAPRED UNI-PAK 21 TAB) 10 MG (21) TBPK tablet Take by mouth daily. Take as directed. Patient  not taking: Reported on 10/28/2019 11/06/18   Mardella Layman, MD    Allergies    Eggs or egg-derived products and Shellfish allergy  Review of Systems   Review of Systems  Constitutional: Negative for appetite change, chills and fever.  HENT: Negative for ear pain, rhinorrhea, sneezing and sore throat.   Eyes: Negative for photophobia and visual disturbance.  Respiratory: Negative for cough, chest tightness, shortness of breath and wheezing.   Cardiovascular: Negative for chest pain and palpitations.  Gastrointestinal: Negative for abdominal pain, blood in stool, constipation, diarrhea, nausea and vomiting.  Genitourinary: Negative for dysuria, hematuria and urgency.  Musculoskeletal: Negative for myalgias.  Skin: Positive for wound. Negative for rash.  Neurological: Negative for dizziness, weakness and light-headedness.    Physical Exam Updated Vital Signs BP 132/87 (BP Location: Left Arm)   Pulse 68   Temp 97.9 F (36.6 C) (Oral)   Resp 17   SpO2 100%   Physical Exam Vitals and nursing note reviewed.  Constitutional:      General: He is not in acute distress.    Appearance: He is well-developed.  HENT:     Head: Normocephalic and atraumatic.     Nose: Nose normal.  Eyes:     General: No scleral icterus.       Left eye: No discharge.  Conjunctiva/sclera: Conjunctivae normal.  Cardiovascular:     Rate and Rhythm: Normal rate and regular rhythm.     Heart sounds: Normal heart sounds. No murmur heard.  No friction rub. No gallop.   Pulmonary:     Effort: Pulmonary effort is normal. No respiratory distress.     Breath sounds: Normal breath sounds.  Abdominal:     General: Bowel sounds are normal. There is no distension.     Palpations: Abdomen is soft.     Tenderness: There is no abdominal tenderness. There is no guarding.  Musculoskeletal:        General: Normal range of motion.     Cervical back: Normal range of motion and neck supple.  Skin:    General: Skin is  warm and dry.     Findings: Laceration present. No rash.     Comments: 7cm gaping laceration with pulsatile bleeding. 2+ radial pulse noted. Normal sensation to light touch. Able to flex and extend wrist.  Neurological:     Mental Status: He is alert.     Motor: No abnormal muscle tone.     Coordination: Coordination normal.         ED Results / Procedures / Treatments   Labs (all labs ordered are listed, but only abnormal results are displayed) Labs Reviewed  BASIC METABOLIC PANEL - Abnormal; Notable for the following components:      Result Value   Glucose, Bld 116 (*)    All other components within normal limits  SARS CORONAVIRUS 2 BY RT PCR (HOSPITAL ORDER, PERFORMED IN [facility])  CBC WITH DIFFERENTIAL/PLATELET  TYPE AND SCREEN  ABO/RH    EKG None  Radiology DG Forearm Right  Result Date: 10/28/2019 CLINICAL DATA:  Laceration to the forearm with bleeding EXAM: RIGHT FOREARM - 2 VIEW COMPARISON:  None. FINDINGS: No fracture or malalignment. Large laceration at the mid forearm. Ovoid opacity at the level of laceration presumably relates to soft tissue artifact. No definitive radiopaque foreign body is seen. IMPRESSION: 1. Soft tissue laceration at the mid forearm. No acute osseous abnormality. Electronically Signed   By: Jasmine Pang M.D.   On: 10/28/2019 20:04    Procedures .Critical Care Performed by: Dietrich Pates, PA-C Authorized by: Dietrich Pates, PA-C   Critical care provider statement:    Critical care time (minutes):  45   Critical care was necessary to treat or prevent imminent or life-threatening deterioration of the following conditions:  Circulatory failure, respiratory failure and shock   Critical care was time spent personally by me on the following activities:  Development of treatment plan with patient or surrogate, discussions with consultants, evaluation of patient's response to treatment, examination of patient, ordering and performing  treatments and interventions, ordering and review of laboratory studies, ordering and review of radiographic studies and re-evaluation of patient's condition   I assumed direction of critical care for this patient from another provider in my specialty: no     (including critical care time)  Medications Ordered in ED Medications  HYDROmorphone (DILAUDID) injection 1 mg (1 mg Intravenous Given 10/28/19 1922)  fentaNYL (SUBLIMAZE) injection 50 mcg (50 mcg Intravenous Given 10/28/19 2011)  sodium chloride 0.9 % bolus 1,000 mL (0 mLs Intravenous Stopped 10/28/19 2041)    ED Course  I have reviewed the triage vital signs and the nursing notes.  Pertinent labs & imaging results that were available during my care of the patient were reviewed by me and considered in my  medical decision making (see chart for details).    MDM Rules/Calculators/A&P                          29 year old male presenting to the ED for right forearm laceration that occurred prior to arrival.  Was walking down the steps when he lost his balance and cut his right forearm through window.  Patient with large laceration to R forearm with bleeding and hematoma noted. He was neurologically intact with normal distal pulses and good sensation to light touch. A tourniquet was applied but he became very uncomfortable.  As I removed the tourniquet, noticed arterial bleeding.  I got in touch with Dr. Arbie Cookey of vascular surgery who evaluated the patient at the bedside.  He did become diaphoretic with blood pressures in the 70s systolic.  He was given 3 L of normal saline with improvement in his blood pressure.  In the meantime lab work including CBC, type and screen and Covid test were obtained.  He was taken to the OR by Dr. Arbie Cookey for repair of his arterial injury.   Portions of this note were generated with Scientist, clinical (histocompatibility and immunogenetics). Dictation errors may occur despite best attempts at proofreading.  Final Clinical Impression(s) / ED  Diagnoses Final diagnoses:  Laceration of right upper extremity, initial encounter    Rx / DC Orders ED Discharge Orders    None       Brooks Sailors 10/28/19 2127    Tilden Fossa, MD 10/28/19 2302

## 2019-10-28 NOTE — ED Notes (Signed)
[id number] 

## 2019-10-28 NOTE — Op Note (Signed)
    OPERATIVE REPORT  DATE OF SURGERY: 10/28/2019  PATIENT: Connor Brennan, 29 y.o. male MRN: [id number]  DOB: 1990-11-27  PRE-OPERATIVE DIAGNOSIS: Laceration to right forearm with probable arterial injury  POST-OPERATIVE DIAGNOSIS:  Same  PROCEDURE: Exploration of wound by Dr. Orlan Leavens with orthopedic hand surgery. Ligation of transected right radial artery  SURGEON:  Gretta Began, M.D.    The assistant was needed for exposure and to expedite the case  ANESTHESIA: General  EBL: per anesthesia record  Total I/O In: 1250 [IV Piggyback:1250] Out: -   BLOOD ADMINISTERED: none  DRAINS: none  SPECIMEN: none  COUNTS CORRECT:  YES  PATIENT DISPOSITION:  PACU - hemodynamically stable  PROCEDURE DETAILS: The patient was taken up and placed to position where the area of the right arm was prepped and draped you sterile fashion. Tourniquet was placed in the high upper arm. The expiration will be dictated as a separate note by Dr. Orlan Leavens. He had obvious tendon injury no obvious nerve injury. Prior to inflation of the tourniquet he did have biphasic signal to the ulnar and monophasic signal at the radial artery. On exploration the ulnar artery was intact. The patient had transection of the radial artery. It was very small and significant portion of the artery was disrupted. And freeing up the ends of the artery there was no way for primary closure and the artery was extremely small. With the tourniquet flighted there was pulsatile backbleeding from the distal radial artery below the transection. Decision was made to ligate this since he obviously had adequate palmar arch. The radial artery was ligated proximally and distally with 3-0 silk ties. The remainder the procedure will be dictated as a separate note with Dr. Vance Peper, M.D., [facility] 10/28/2019 9:54 PM

## 2019-10-28 NOTE — ED Notes (Addendum)
This RN at bedside with PA, pt diaphoretic complaining of pain at site of tourniquet  Kim RN applying pressure to active bleeding site, tourniquet  BP 68/29 Pt becoming lethargic, responding to voice appropriately  1L NS hung via pressure bag Surgeon to bedside at this time 2nd line started 1L NS started

## 2019-10-28 NOTE — Progress Notes (Signed)
Patient's valuables inventoried with Engineer, materials Jimmy S. and sent to security.  Yellow copy of patient valuables envelope placed in chart.

## 2019-10-28 NOTE — OR Nursing (Signed)
Patient belongings including: pack of cigarettes, cigarette lighter, black wallet, patient [place] ID, Black [employer], EBT [employer] times 2, Social [employer], $20.00 cash, Agricultural engineer card, silver colored chain placed in bag and labeled with patient ID.  Patient pair black [employer], black t-shirt, black and gray shorts, denim shorts and black belt placed in patient belonging bag and labeled with patient ID. Given to Chip [employer] on arrival in PACU.

## 2019-10-28 NOTE — OR Nursing (Signed)
Verified patient allergies.  Patient states he has eaten shellfish since initial allergy was noted and without reaction.  Patient states no known allergy to betadine solution.  Betadine solution used for surgical prep.

## 2019-10-28 NOTE — Op Note (Signed)
PREOPERATIVE DIAGNOSIS: Right forearm laceration with tendon involvement   POSTOPERATIVE DIAGNOSIS: Same  ATTENDING SURGEON: Dr. Bradly Bienenstock who scrubbed and present for the entire procedure  ASSISTANT SURGEON: None  ANESTHESIA: General via endotracheal anesthesia  OPERATIVE PROCEDURE: Right forearm flexor carpi radialis tendon repair Right forearm flexor digitorum superficialis repair Traumatic laceration repair 8 cm  IMPLANTS: None  RADIOGRAPHIC INTERPRETATION: None  SURGICAL INDICATIONS: Patient was seen and evaluated by the vascular service with an arterial injury to the forearm after a laceration.  Patient was taken emergently to the operating room by the vascular surgeon Dr. Karlene Lineman.  I was consulted intraoperatively for evaluation of the complex laceration of the forearm.  SURGICAL TECHNIQUE: Patient had been properly identified and a timeout was called.  The right upper extremity had been prepped and draped in normal sterile fashion.  Well-padded tourniquet of been placed on the right brachium.  The right upper extremity was then prepped and draped in normal sterile fashion.  A centimeter laceration was extended proximally and distally.  Skin flaps were then raised.  Given the laceration along the ulnar aspect careful dissection was then carried out to identify the ulnar nerve and ulnar artery which were both in continuity.  The median nerve was then carefully dissected through the flexor digitorum superficialis and was in continuity.  The patient did have the injury at the musculotendinous junction to the FCR into the flexor digitorum superficialis.  Further dissection was then carried out radially where the patient did have the transection of the radial artery.  The arterial management was done by Dr. Arbie Cookey in a separate operative note was then done explaining in details the management of the arterial component.  After ligation of the artery proximally and distally after the  tourniquet been deflated the FCR musculotendinous junction was then repaired with figure-of-eight 2-0 Vicryl suture.  This was also done with the flexor digitorum superficialis closing the fascial layer nicely at the musculotendinous junction.  After of thorough wound irrigation the skin was then closed with 3-0 Prolene simple and horizontal mattress sutures.  The extended laceration was closed with simple Prolene suture.  Adaptic dressing sterile compressive bandage then applied.  The patient then placed in a well-padded volar splint extubated taken recovery room in good condition.  POSTOPERATIVE PLAN: Patient admitted to the vascular service overnight for observation.  Patient can go home keep the splint on at all times.  I will need to see him back in the office in approximately 2 weeks for wound check suture removal and then will manage the repair of the flexor tendons and FCR likely put him in a cast to the 4-week mark and then at the 4-week mark begin an therapy regimen.  No radiographs the first visit.

## 2019-10-28 NOTE — ED Triage Notes (Signed)
Pt arrives to ED via [facility] from home w/ c/o R arm laceration. Pt lost his balance going down some stairs and braced himself on a window which broke and sliced his arm. EMS VSS:, positive distal pulses per EMS.   BP 125/80 RR 18 SPO2 100% RA HR 85

## 2019-10-28 NOTE — ED Notes (Signed)
Bleeding controlled at this time, bandaging applied to site. Pt A&Ox4, denies nausea, BP responded well to fluids 103/32

## 2019-10-28 NOTE — Transfer of Care (Signed)
Immediate Anesthesia Transfer of Care Note  Patient: Connor Brennan  Procedure(s) Performed: REPAIR OF RIGHT FOREARM LACERATION (Right )  Patient Location: PACU  Anesthesia Type:General  Level of Consciousness: patient cooperative and responds to stimulation  Airway & Oxygen Therapy: Patient Spontanous Breathing  Post-op Assessment: Report given to RN, Post -op Vital signs reviewed and stable and Patient moving all extremities X 4  Post vital signs: Reviewed and stable  Last Vitals:  Vitals Value Taken Time  BP 129/73 10/28/19 2224  Temp 36.4 C 10/28/19 2224  Pulse 97 10/28/19 2228  Resp 18 10/28/19 2228  SpO2 96 % 10/28/19 2228  Vitals shown include unvalidated device data.  Last Pain:  Vitals:   10/28/19 1858  TempSrc:   PainSc: 7          Complications: No complications documented.

## 2019-10-28 NOTE — ED Notes (Signed)
3rd 1L NS hung at this time

## 2019-10-28 NOTE — Consult Note (Signed)
[facility] of [facility]  Patient name: Connor Brennan MRN: [id number] DOB: 04/26/1990 Sex: male    HPI: Connor Brennan is a 29 y.o. male seen in the emergency room for laceration in his right arm.  Apparently he fell and braced his arm and had a large laceration on the medial aspect of his right forearm.  He had great deal of blood at the scene and was brought by EMS.  He was seen in the emergency department and had significant bleeding and had tourniquet placed.  In discussing with the emergency room physician assistant, the patient was grossly intact neurologically in his hand.  Due to the significant arterial bleeding I was consulted.  On arrival the patient has blood pressure of 65 and is very diaphoretic.  Has received 3 units of crystalloid.  His blood work is just been drawn.  Past Medical History:  Diagnosis Date   Asthma     Family History  Problem Relation Age of Onset   Healthy Mother    Healthy Father     SOCIAL HISTORY: Social History   Tobacco Use   Smoking status: Current Every Day Smoker    Packs/day: 0.50    Types: Cigarettes   Smokeless tobacco: Never Used  Substance Use Topics   Alcohol use: Yes    Comment: social    Allergies  Allergen Reactions   Eggs Or Egg-Derived Products Hives    Patient doesn't think he is allergic to eggs anymore   Shellfish Allergy Swelling    face    No current facility-administered medications for this encounter.   Current Outpatient Medications  Medication Sig Dispense Refill   cephALEXin (KEFLEX) 500 MG capsule Take 2 capsules (1,000 mg total) by mouth 2 (two) times daily. (Patient not taking: Reported on 10/06/2019) 20 capsule 0   famotidine (PEPCID) 20 MG tablet Take 1 tablet (20 mg total) by mouth 2 (two) times daily. 30 tablet 0   ibuprofen (ADVIL,MOTRIN) 600 MG tablet Take 1 tablet (600 mg total) by mouth every 6 (six) hours as needed. (Patient not  taking: Reported on 10/06/2019) 30 tablet 0   predniSONE (STERAPRED UNI-PAK 21 TAB) 10 MG (21) TBPK tablet Take by mouth daily. Take as directed. (Patient not taking: Reported on 10/06/2019) 21 tablet 0    REVIEW OF SYSTEMS:  Negative except for asthma  PHYSICAL EXAM: Vitals:   10/28/19 1856 10/28/19 2013  BP: 132/83 (!) 113/44  Pulse: 96   Resp: 20 (!) 7  Temp: 97.9 F (36.6 C)   TempSrc: Oral   SpO2: 100% 100%    GENERAL: The patient is a well-nourished male, in no acute distress. The vital signs are documented above. CARDIOVASCULAR: Tourniquet inflated therefore unable to assess pulses in his right hand. PULMONARY: There is good air exchange  MUSCULOSKELETAL: There are no major deformities or cyanosis. NEUROLOGIC: No focal weakness or paresthesias are detected. SKIN: Large laceration over the medial aspect of his right forearm with muscle bodies exposed PSYCHIATRIC: The patient has a normal affect.  DATA:  Labs pending  MEDICAL ISSUES: Significant arterial injury from medial forearm laceration.  Will take immediately to the operating room for exploration.  Have also discussed with Dr. Orlan Leavens with orthopedic hand who will examined in the operating room  as well for potential nerve or tendon injury.    Larina Earthly, MD FACS [facility] of [facility] Tel [phone] Pager [phone] lgm

## 2019-10-28 NOTE — Anesthesia Procedure Notes (Signed)
Procedure Name: Intubation Date/Time: 10/28/2019 9:12 PM Performed by: Verdie Drown, CRNA Pre-anesthesia Checklist: Patient identified, Emergency Drugs available, Suction available and Patient being monitored Patient Re-evaluated:Patient Re-evaluated prior to induction Oxygen Delivery Method: Circle System Utilized Preoxygenation: Pre-oxygenation with 100% oxygen Induction Type: IV induction, Rapid sequence and Cricoid Pressure applied Laryngoscope Size: Mac and 4 Grade View: Grade I Tube type: Oral Number of attempts: 1 Airway Equipment and Method: Stylet and Video-laryngoscopy Placement Confirmation: ETT inserted through vocal cords under direct vision,  positive ETCO2 and breath sounds checked- equal and bilateral Secured at: 25 cm Tube secured with: Tape Dental Injury: Teeth and Oropharynx as per pre-operative assessment

## 2019-10-28 NOTE — Anesthesia Preprocedure Evaluation (Addendum)
Anesthesia Evaluation  Patient identified by MRN, date of birth, ID band Patient awake    Reviewed: Allergy & Precautions, NPO status , Patient's Chart, lab work & pertinent test results, Unable to perform ROS - Chart review onlyPreop documentation limited or incomplete due to emergent nature of procedure.  History of Anesthesia Complications Negative for: history of anesthetic complications  Airway Mallampati: I  TM Distance: >3 FB Neck ROM: Full    Dental  (+) Dental Advisory Given, Teeth Intact   Pulmonary asthma , Current SmokerPatient did not abstain from smoking.,    Pulmonary exam normal        Cardiovascular Normal cardiovascular exam     Neuro/Psych TIA   GI/Hepatic (+)     substance abuse  marijuana use,   Endo/Other    Renal/GU      Musculoskeletal   Abdominal   Peds  Hematology   Anesthesia Other Findings Covid test not yet done  Reproductive/Obstetrics                           Anesthesia Physical Anesthesia Plan  ASA: III and emergent  Anesthesia Plan: General   Post-op Pain Management:    Induction: Intravenous and Rapid sequence  PONV Risk Score and Plan: 3 and Treatment may vary due to age or medical condition, Ondansetron, Dexamethasone and Midazolam  Airway Management Planned: Oral ETT  Additional Equipment: None  Intra-op Plan:   Post-operative Plan: Extubation in OR  Informed Consent: I have reviewed the patients History and Physical, chart, labs and discussed the procedure including the risks, benefits and alternatives for the proposed anesthesia with the patient or authorized representative who has indicated his/her understanding and acceptance.     Only emergency history available  Plan Discussed with: CRNA and Anesthesiologist  Anesthesia Plan Comments:       Anesthesia Quick Evaluation

## 2019-10-28 NOTE — Discharge Instructions (Signed)
KEEP BANDAGE CLEAN AND DRY °CALL OFFICE FOR F/U APPT [phone] in 14 days °KEEP HAND ELEVATED ABOVE HEART °OK TO APPLY ICE TO OPERATIVE AREA °CONTACT OFFICE IF ANY WORSENING PAIN OR CONCERNS. °

## 2019-10-29 ENCOUNTER — Encounter (HOSPITAL_COMMUNITY): Payer: Self-pay | Admitting: Vascular Surgery

## 2019-10-29 LAB — ABO/RH: ABO/RH(D): O POS

## 2019-10-29 MED ORDER — OXYCODONE-ACETAMINOPHEN 10-325 MG PO TABS: 1.0000 | ORAL_TABLET | ORAL | 0 refills | Status: AC | PRN

## 2019-10-29 MED ORDER — PROPOFOL 10 MG/ML IV BOLUS
INTRAVENOUS | Status: AC
  Filled 2019-10-29: qty 20

## 2019-10-29 MED ORDER — FENTANYL CITRATE (PF) 250 MCG/5ML IJ SOLN
INTRAMUSCULAR | Status: AC
  Filled 2019-10-29: qty 5

## 2019-10-29 NOTE — Anesthesia Postprocedure Evaluation (Signed)
Anesthesia Post Note  Patient: Connor Brennan  Procedure(s) Performed: Ligation of Transected Right Radial Artery (Right Arm Lower) Right Forearm Laceration with Tendon Involvement, Right Forearm Flexor  Carpiradialis Tendon Repair, Right Forearm Flexor Digitorium Superficialis Repair Traumatic Laceration Repair 8cm (Right Arm Lower)     Patient location during evaluation: PACU Anesthesia Type: General Level of consciousness: awake and alert Pain management: pain level controlled Vital Signs Assessment: post-procedure vital signs reviewed and stable Respiratory status: spontaneous breathing, nonlabored ventilation and respiratory function stable Cardiovascular status: blood pressure returned to baseline and stable Postop Assessment: no apparent nausea or vomiting Anesthetic complications: no   No complications documented.  Last Vitals:  Vitals:   10/28/19 2332 10/29/19 0431  BP: 91/76 117/71  Pulse: 65 60  Resp: 18 16  Temp: 36.5 C 36.8 C  SpO2: 100% 99%    Last Pain:  Vitals:   10/29/19 0431  TempSrc: Oral  PainSc:                  Beryle Lathe

## 2019-10-29 NOTE — Plan of Care (Signed)
Discharge to home °

## 2019-10-29 NOTE — Progress Notes (Signed)
  Progress Note    10/29/2019 7:15 AM 1 Day Post-Op  Subjective:  Good bit of post-op pain. Awake and alert in NAD.   Vitals:   10/28/19 2332 10/29/19 0431  BP: 91/76 117/71  Pulse: 65 60  Resp: 18 16  Temp: 97.7 F (36.5 C) 98.2 F (36.8 C)  SpO2: 100% 99%    Physical Exam: Cardiac: RRR Lungs:  CTAB Extremities:  RUE in splint and ACE wrap. His hand is warm with good grip strength in distal digits Abdomen:  soft  CBC    Component Value Date/Time   WBC 7.6 10/28/2019 1946   RBC 4.43 10/28/2019 1946   HGB 14.1 10/28/2019 1946   HCT 42.2 10/28/2019 1946   PLT 314 10/28/2019 1946   MCV 95.3 10/28/2019 1946   MCH 31.8 10/28/2019 1946   MCHC 33.4 10/28/2019 1946   RDW 13.6 10/28/2019 1946   LYMPHSABS 2.5 10/28/2019 1946   MONOABS 0.5 10/28/2019 1946   EOSABS 0.1 10/28/2019 1946   BASOSABS 0.1 10/28/2019 1946    BMET    Component Value Date/Time   NA 141 10/28/2019 1946   K 4.9 10/28/2019 1946   CL 110 10/28/2019 1946   CO2 22 10/28/2019 1946   GLUCOSE 116 (H) 10/28/2019 1946   BUN 6 10/28/2019 1946   CREATININE 1.19 10/28/2019 1946   CALCIUM 8.9 10/28/2019 1946   GFRNONAA >60 10/28/2019 1946   GFRAA >60 10/28/2019 1946     Intake/Output Summary (Last 24 hours) at 10/29/2019 0715 Last data filed at 10/28/2019 2211 Gross per 24 hour  Intake 2050 ml  Output --  Net 2050 ml    HOSPITAL MEDICATIONS Scheduled Meds: . pantoprazole  40 mg Oral Daily   Continuous Infusions: PRN Meds:.alum & mag hydroxide-simeth, guaiFENesin-dextromethorphan, hydrALAZINE, labetalol, metoprolol tartrate, morphine injection, ondansetron, oxyCODONE-acetaminophen, phenol  Assessment: POD 1 laceration of right forearm with ligation of right radial artery. Right forearm tendon repair by Dr. Melvyn Novas  Plan: -OOB and ambulate in hallway. DC home. Splint in place at all times and follow-up with ortho in 2 weeks Follow-up with [facility] as needed -DVT prophylaxis:  SCDs  Wendi Maya, PA-C [facility] [phone] 10/29/2019  7:15 AM

## 2019-10-29 NOTE — Progress Notes (Signed)
Patient ID: Connor Brennan, male   DOB: [birth year], 29 y.o.   MRN: [id number]   Progress Note    10/29/2019 7:08 AM 1 Day Post-Op  Subjective: Comfortable this morning.  Reports soreness in his forearm laceration site.   Vitals:   10/28/19 2332 10/29/19 0431  BP: 91/76 117/71  Pulse: 65 60  Resp: 18 16  Temp: 97.7 F (36.5 C) 98.2 F (36.8 C)  SpO2: 100% 99%   Physical Exam: Splint intact over arm and hand.  Fingertips are warm and well-perfused.  Seems to have normal sensation in his fingers.  CBC    Component Value Date/Time   WBC 7.6 10/28/2019 1946   RBC 4.43 10/28/2019 1946   HGB 14.1 10/28/2019 1946   HCT 42.2 10/28/2019 1946   PLT 314 10/28/2019 1946   MCV 95.3 10/28/2019 1946   MCH 31.8 10/28/2019 1946   MCHC 33.4 10/28/2019 1946   RDW 13.6 10/28/2019 1946   LYMPHSABS 2.5 10/28/2019 1946   MONOABS 0.5 10/28/2019 1946   EOSABS 0.1 10/28/2019 1946   BASOSABS 0.1 10/28/2019 1946    BMET    Component Value Date/Time   NA 141 10/28/2019 1946   K 4.9 10/28/2019 1946   CL 110 10/28/2019 1946   CO2 22 10/28/2019 1946   GLUCOSE 116 (H) 10/28/2019 1946   BUN 6 10/28/2019 1946   CREATININE 1.19 10/28/2019 1946   CALCIUM 8.9 10/28/2019 1946   GFRNONAA >60 10/28/2019 1946   GFRAA >60 10/28/2019 1946    INR No results found for: INR   Intake/Output Summary (Last 24 hours) at 10/29/2019 0708 Last data filed at 10/28/2019 2211 Gross per 24 hour  Intake 2050 ml  Output --  Net 2050 ml     Assessment/Plan:  29 y.o. male stable for discharge this morning.  No need for follow-up with vascular.  Follow-up arranged by Dr. Orlan Leavens for hand follow-up     Larina Earthly, MD [facility] [phone] 10/29/2019 7:08 AM

## 2019-10-29 NOTE — Progress Notes (Signed)
IV's and telemetry discontinued. CCMD notified. Discharge instructions reviewed with patient. All questions answered.   

## 2019-10-29 NOTE — Plan of Care (Signed)
Continue to monitor

## 2019-10-29 NOTE — Discharge Summary (Signed)
  Discharge Summary  Patient ID: Connor Brennan [id number] 29 y.o. [birth year]  Admit date: 10/28/2019  Discharge date and time: 10/29/2019 12:14 PM   Admitting Physician: Larina Earthly, MD   Discharge Physician: Gretta Began, MD  Admission Diagnoses: Laceration of right upper extremity, initial encounter [S41.111A] Laceration of right upper extremity with complication, initial encounter [S41.111A]  Discharge Diagnoses: Laceration of right upper extremity, initial encounter [S41.111A] Laceration of right upper extremity with complication, initial encounter [S41.111A]  Admission Condition: good  Discharged Condition: good  Indication for Admission: traumatic injury to right dressed in surgical dressings  Hospital Course: Patient is a 29 year old male who sustained a laceration to his right forearm with a probable arterial injury.  He was taken emergently to the operating room where he underwent exploration and ligation of transected right radial artery.  The wound was explored by Dr. Orlan Leavens with orthopedic hand surgery.  Tolerated the procedure well and was taken to PACU in satisfactory condition.  He remained hemodynamically stable.  On postoperative day 1, his vital signs were stable he was afebrile.  He was voiding spontaneously.  He was tolerating his diet although his appetite was depressed.  His right hand surgical dressing was dry and intact.  His hand was warm with active range of motion and intact sensation.  Consults: orthopedic surgery  Treatments: surgery: As above  Discharge Exam:  Vitals:   10/29/19 0749 10/29/19 1149  BP: 123/65 119/70  Pulse: 63 66  Resp: 18 13  Temp: 97.7 F (36.5 C) 98.1 F (36.7 C)  SpO2: 100% 100%   Cardiac: Rate and rhythm regular Lungs: To auscultation bilaterally Extremities:  Right upper extremity with surgical dressings intact.  His right hand is warm with active range of motion of his digits, good hand grip strength and intact  sensation. Neurologic: Alert and oriented x4.   Disposition: Discharge disposition: 01-Home or Self Care       Patient Instructions:  Allergies as of 10/29/2019      Reactions   Eggs Or Egg-derived Products Hives, Other (See Comments)   Patient doesn't think he is allergic to eggs anymore (??)   Shellfish Allergy Swelling, Other (See Comments)   Face swells      Medication List    TAKE these medications   cephALEXin 500 MG capsule Commonly known as: KEFLEX Take 2 capsules (1,000 mg total) by mouth 2 (two) times daily.   famotidine 20 MG tablet Commonly known as: PEPCID Take 1 tablet (20 mg total) by mouth 2 (two) times daily.   ibuprofen 600 MG tablet Commonly known as: ADVIL Take 1 tablet (600 mg total) by mouth every 6 (six) hours as needed.   oxyCODONE-acetaminophen 10-325 MG tablet Commonly known as: Percocet Take 1 tablet by mouth every 4 (four) hours as needed for pain.   predniSONE 10 MG (21) Tbpk tablet Commonly known as: STERAPRED UNI-PAK 21 TAB Take by mouth daily. Take as directed.      Activity: no driving for 2 weeks Diet: regular diet Wound Care: keep wound clean and dry.  Leave splint intact at all times.  Follow-up with Dr. Orlan Leavens in 2 weeks.  Directed to call Dr. Bari Edward office for increasing pain or concerns with right upper extremity function or dressings.  Signed: Milinda Antis, PA-C 10/29/2019 4:03 PM [facility] Office: [phone]

## 2019-11-02 ENCOUNTER — Encounter (HOSPITAL_COMMUNITY): Payer: Self-pay | Admitting: Vascular Surgery

## 2019-11-13 DIAGNOSIS — Z4789 Encounter for other orthopedic aftercare: Secondary | ICD-10-CM | POA: Insufficient documentation

## 2019-11-13 DIAGNOSIS — S51811A Laceration without foreign body of right forearm, initial encounter: Secondary | ICD-10-CM | POA: Insufficient documentation

## 2019-12-03 ENCOUNTER — Ambulatory Visit: Payer: Self-pay | Attending: Physician Assistant | Admitting: *Deleted

## 2019-12-03 ENCOUNTER — Encounter: Payer: Self-pay | Admitting: *Deleted

## 2019-12-03 ENCOUNTER — Other Ambulatory Visit: Payer: Self-pay

## 2019-12-03 DIAGNOSIS — M25641 Stiffness of right hand, not elsewhere classified: Secondary | ICD-10-CM | POA: Insufficient documentation

## 2019-12-03 DIAGNOSIS — R209 Unspecified disturbances of skin sensation: Secondary | ICD-10-CM | POA: Insufficient documentation

## 2019-12-03 DIAGNOSIS — M6281 Muscle weakness (generalized): Secondary | ICD-10-CM | POA: Insufficient documentation

## 2019-12-03 DIAGNOSIS — M79601 Pain in right arm: Secondary | ICD-10-CM | POA: Insufficient documentation

## 2019-12-03 DIAGNOSIS — R278 Other lack of coordination: Secondary | ICD-10-CM | POA: Insufficient documentation

## 2019-12-03 DIAGNOSIS — R208 Other disturbances of skin sensation: Secondary | ICD-10-CM

## 2019-12-03 NOTE — Therapy (Signed)
[facility] [address] [place], [place], [address] Phone: [phone]   Fax:  [phone]  Occupational Therapy Evaluation  Patient Details  Name: Connor Brennan MRN: [id number] Date of Birth: 1990-10-17 Referring Provider (OT): Dr Griffin Basil, PA-C   Encounter Date: 12/03/2019   OT End of Session - 12/03/19 1216    Visit Number 1    Number of Visits 10    Date for OT Re-Evaluation 02/04/20    Authorization Type Self pay    OT Start Time 1017    OT Stop Time 1127    OT Time Calculation (min) 70 min    Activity Tolerance Patient tolerated treatment well    Behavior During Therapy [facility] for tasks assessed/performed           Past Medical History:  Diagnosis Date  . Asthma     Past Surgical History:  Procedure Laterality Date  . AV FISTULA PLACEMENT Right 10/28/2019   Procedure: Ligation of Transected Right Radial Artery;  Surgeon: Larina Earthly, MD;  Location: [facility] OR;  Service: Vascular;  Laterality: Right;  . HAND SURGERY Right   . TENDON REPAIR Right 10/28/2019   Procedure: Right Forearm Laceration with Tendon Involvement, Right Forearm Flexor  Carpiradialis Tendon Repair, Right Forearm Flexor Digitorium Superficialis Repair Traumatic Laceration Repair 8cm;  Surgeon: Bradly Bienenstock, MD;  Location: [facility] OR;  Service: Orthopedics;  Laterality: Right;    There were no vitals filed for this visit.   Subjective Assessment - 12/03/19 1020    Subjective  Pt is s/p R FCR, FDS, artery and laceration repair on 10/28/2019. He presents today for protective splinting per Dr Melvyn Novas. Pt fell down sisters stairs and arm went through window resulting in forearm laceration.    Pertinent History Non-significant PMH per pt report. Pt does report seasonal ashtma.    Patient Stated Goals Get use of hand and forearm back.  Pt with pain in certain positions or with certain movement    Currently in Pain? No/denies   0/10    Multiple Pain Sites No             [facility] OT Assessment - 12/03/19 0001      Assessment   Medical Diagnosis R FCR, R FDS, radial artery repair and forearm laceration repair volar forearm    Referring Provider (OT) Dr Griffin Basil, PA-C    Onset Date/Surgical Date 10/28/19    Hand Dominance Right    Next MD Visit 12/07/19      Precautions   Precautions None   Do not ue right UE or do too much     Balance Screen   Has the patient fallen in the past 6 months Yes    How many times? 1 time day of injury    Has the patient had a decrease in activity level because of a fear of falling?  No    Is the patient reluctant to leave their home because of a fear of falling?  No      Home  Environment   Family/patient expects to be discharged to: Private residence    Living Arrangements --   Family   Available Help at Discharge Family    Type of Home House    Lives With Family   can assist PRN     Prior Function   Level of Independence Independent;Independent with basic ADLs    Vocation Unemployed    Chiropodist    Leisure  Binge watch TV, goes to park, likes to fish      ADL   ADL comments Pt reports Mod I basic ADL's       Mobility   Mobility Status Independent      Written Expression   Dominant Hand Right      Vision - History   Baseline Vision No visual deficits      Cognition   Overall Cognitive Status Within Functional Limits for tasks assessed      Observation/Other Assessments   Observations Pt presents to clinic today w/o cast or post-op dressing. He is here for custom splinting per Dr Melvyn Novasrtmann. He states that his post op cast was removed on 11/27/19 and he has not had a protective splint or cast on since then. He states that he was advised to "Not do too much with it" at that appointment.    Skin Integrity Scar is well healed and w/o drainage or s/s of possible infection noted.   Scar puckering noted in a few areas volar forearm      Sensation   Light Touch Appears Intact      Coordination   Gross Motor Movements are Fluid and Coordinated No    Fine Motor Movements are Fluid and Coordinated No    Coordination Deficits secondary to recent surgery and healing      Edema   Edema Pt /o edema as observed in clinic setting and left UE is compared to R visually.      ROM / Strength   AROM / PROM / Strength AROM;Strength      AROM   Overall AROM  Deficits;Unable to assess   NT secondary to surgery     Strength   Overall Strength Deficits;Unable to assess   NT secondary to surgery                   OT Treatments/Exercises (OP) - 12/03/19 0001      ADLs   ADL Comments Pt was educated in splinting use, care and precautions and ADL's. He was also educated in gentle active ROM for wrist, flex/extension, RD/UD, tendon gliding, and scar desensitization/management as he is currently 5 weeks and 1 day post-op. He will remove his splint for hygeine and home program and will wear at all other times for protection. He was given written and verbal instructions in all of the above.      Splinting   Splinting As stated previously, pt presented for right custom volar splint following repair to right FCR, FDS, radial artery and laceration to his right forearm. DOI/DOS 10/28/2019. He is currently 5 weeks and 1 day post-op. He states that his protective forearm cast/post-op dressing was removed in Dr Evaristo Buryrtmanns office 1 week ago (11/27/2019) and he has not had protection since then. As per MD orders, he was fitted with a custom volar forearm/wrist splint that places hi sright wrist in neutral and his fingers are free for AROM. He was educated in splinting use, care and precautions today in the clinic and he verblaized understanding of all of the above.           WEARING SCHEDULE:  Wear splint at ALL times except for hygiene care (May remove splint for exercises and then immediately place back on ONLY if directed by the  therapist)  PURPOSE:  To prevent movement and for protection until injury can heal  CARE OF SPLINT:  Keep splint away from heat sources including: stove, radiator or furnace, or  a car in sunlight. The splint can melt and will no longer fit you properly  Keep away from pets and children  Clean the splint with rubbing alcohol as needed.  * During this time, make sure you also clean your hand/arm as instructed by your therapist and/or perform dressing changes as needed. Then dry hand/arm completely before replacing splint. (When cleaning hand/arm, keep it immobilized in same position until splint is replaced)  PRECAUTIONS/POTENTIAL PROBLEMS: *If you notice or experience increased pain, swelling, numbness, or a lingering reddened area from the splint: Contact your therapist immediately by calling [phone]. You must wear the splint for protection, but we will get you scheduled for adjustments as quickly as possible.  (If only straps or hooks need to be replaced and NO adjustments to the splint need to be made, just call the office ahead and let them know you are coming in)  If you have any medical concerns or signs of infection, please call your doctor immediately  Desensitization Techniques  Perform these exercises every 2 hours for 10-15 minute sessions.   Scar Massage Purpose: To soften/smooth scar tissue.   To desensitize sensitive areas after surgery.   To mechanically break up inner scar tissue, adhesions, therefore allowing freer        movement of injured tendons and muscle.  Technique: Use a cream to massage with, as it insures a smooth gliding motion and avoids irritation  caused by rubbing skin to skin.  Cream is preferred over a lotion.   May begin as soon as any suture areas are healed.   Apply a firm, steady pressure with your finger-tip, pulling the skin in a circular motion over the scarred area.  Do not rub.  Message 10-15 minutes, at least 4-6 times a day, unless  you are getting tender afterwards.  Extension (Active With Finger Flexion)    With fingers curled, bend hand back at wrist. Hold ____ seconds. Repeat ____ times. Do ____ sessions per day.  Wrist Flexion / Extension    Hold elbows at 90 and close to body, with palms down. Bend both wrists so fingers point up. Then bend wrist so fingers point down. Repeat sequence ____ times per session. Do ____ sessions per week. Hand Variation: Thumbs up   Flexor Tendon Gliding (Active Full Fist)    Straighten all fingers, then make a fist, bending all joints. Repeat ____ times. Do ____ sessions per day.  Radial / Ulnar Deviation (Assistive)    With palm and wrist flat on table, slide hand side to side like a windshield wiper. Do not move elbow. Repeat ____ times. Do ____ sessions per day.     OT Education - 12/03/19 1215    Education Details Splinting use, care and precautions. Home program, scar management/massage and desensitization right volar forearm.    Person(s) Educated Patient    Methods Explanation;Demonstration;Verbal cues;Handout    Comprehension Verbalized understanding;Returned demonstration            OT Short Term Goals - 12/03/19 1230      OT SHORT TERM GOAL #1   Title Pt will be Mod I splinting use, care and precautions R UE.    Baseline VC's    Time 4    Period Weeks    Status New    Target Date 12/31/19      OT SHORT TERM GOAL #2   Title Pt will be Mod I scar management and massage as seen in clinic setting  Baseline Min vc's and tc's    Time 4    Period Weeks    Status New    Target Date 12/31/19      OT SHORT TERM GOAL #3   Title Pt will be Mod I desensitization R volar forearm scar as observed in clinic    Baseline dependent    Time 4    Period Weeks    Status New    Target Date 12/31/19             OT Long Term Goals - 12/03/19 1232      OT LONG TERM GOAL #1   Title Pt will be Mod I updated HEP R UE for active and PROM as observed  in clinic    Baseline unable    Time 8    Period Weeks    Status New    Target Date 01/28/20      OT LONG TERM GOAL #2   Title Pt will be Mod I strengthening R hand as seen by ability to perform theraputty ex's in clinic    Baseline Unable    Time 8    Period Weeks    Status New    Target Date 01/28/20      OT LONG TERM GOAL #3   Title Pt will demonstrate functional grip of 15# or greater and coordination R dominant hand as seen by JAMAR and 9 hole peg test assessment    Baseline Unable    Time 8    Period Weeks    Status New    Target Date 01/28/20                 Plan - 12/03/19 1217    Clinical Impression Statement Pt is a pleasant 29 y/o right hand dominant male s/p laceration and repair to his right flexor carpi radialis, FDS, radial artery and deep laceration to his volar forearm on 10/28/19, after he tripped when walking down the stairs at his sisters house, and his hand went through a window. 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 He is currently 5 weeks and 1 day post-op and has orders per Dr Melvyn Novas and Lambert Mody, PA-C for a custom short arm volar splint and treatment. As per [place] protocol for FCR and FDS repair, he was educated in gentle active range of motion for right wrist, hand. He was also fitted with a custom volar wrist splint that places his right hand in nutral and his digits are free for active ROM. He should benefit from out-pt OT to address scar management, splinting, need for HEP, desensitization, pt education etc. Of note, pt reports that his post-op cast was removed one week ago, 11/27/2019, in Dr Glenna Durand office and he has not been wearing anything since,    OT Occupational Profile and History Problem Focused Assessment - Including review of records relating to presenting problem    Occupational performance deficits (Please refer to evaluation for details): ADL's;IADL's;Work    Body Structure / Function / Physical Skills  ADL;Strength;Dexterity;Edema;Pain;UE functional use;ROM;Scar mobility;Coordination;Flexibility;Sensation;FMC;Skin integrity;Decreased knowledge of precautions    Rehab Potential Good    Clinical Decision Making Limited treatment options, no task modification necessary    Comorbidities Affecting Occupational Performance: None    Modification or Assistance to Complete Evaluation  No modification of tasks or assist necessary to complete eval    OT Frequency Other (comment)   1x/week for 4 weeks followed by 2x/week for 3  weeks   OT Duration Other (comment)   See above   OT Treatment/Interventions Self-care/ADL training;Fluidtherapy;Splinting;Therapeutic activities;Ultrasound;Therapeutic exercise;Scar mobilization;Passive range of motion;Paraffin;Manual Therapy;Patient/family education    Plan Splint check and adjustment right UE. Check home program and upgrade as able (may begin gentle wrist  PROM PRN  week 6, and light/gentle strengthening week 7). Monitor FDS, FCR and radial artery healing proximal volar forearm as per combined protocol.    Consulted and Agree with Plan of Care Patient           Patient will benefit from skilled therapeutic intervention in order to improve the following deficits and impairments:   Body Structure / Function / Physical Skills: ADL, Strength, Dexterity, Edema, Pain, UE functional use, ROM, Scar mobility, Coordination, Flexibility, Sensation, FMC, Skin integrity, Decreased knowledge of precautions       Visit Diagnosis: Pain in right arm - Plan: Ot plan of care cert/re-cert  Other lack of coordination - Plan: Ot plan of care cert/re-cert  Muscle weakness (generalized) - Plan: Ot plan of care cert/re-cert  Stiffness of right hand, not elsewhere classified - Plan: Ot plan of care cert/re-cert  Other disturbances of skin sensation - Plan: Ot plan of care cert/re-cert    Problem List Patient Active Problem List   Diagnosis Date Noted  . Laceration of right  arm with complication 10/28/2019  . Pes planus, flexible 10/06/2019    Connor Brennan Beth Dixon , OTR/L 12/03/2019, 12:40 PM  [facility] [address] [place], [place], [address] Phone: [phone]   Fax:  [phone]  Name: Connor Brennan MRN: [id number] Date of Birth: 30-Apr-1990

## 2019-12-03 NOTE — Patient Instructions (Signed)
WEARING SCHEDULE:  Wear splint at ALL times except for hygiene care (May remove splint for exercises and then immediately place back on ONLY if directed by the therapist)  PURPOSE:  To prevent movement and for protection until injury can heal  CARE OF SPLINT:  Keep splint away from heat sources including: stove, radiator or furnace, or a car in sunlight. The splint can melt and will no longer fit you properly  Keep away from pets and children  Clean the splint with rubbing alcohol as needed.  * During this time, make sure you also clean your hand/arm as instructed by your therapist and/or perform dressing changes as needed. Then dry hand/arm completely before replacing splint. (When cleaning hand/arm, keep it immobilized in same position until splint is replaced)  PRECAUTIONS/POTENTIAL PROBLEMS: *If you notice or experience increased pain, swelling, numbness, or a lingering reddened area from the splint: Contact your therapist immediately by calling [phone]. You must wear the splint for protection, but we will get you scheduled for adjustments as quickly as possible.  (If only straps or hooks need to be replaced and NO adjustments to the splint need to be made, just call the office ahead and let them know you are coming in)  If you have any medical concerns or signs of infection, please call your doctor immediately  Desensitization Techniques  Perform these exercises every 2 hours for 10-15 minute sessions.   Scar Massage Purpose: To soften/smooth scar tissue.   To desensitize sensitive areas after surgery.   To mechanically break up inner scar tissue, adhesions, therefore allowing freer        movement of injured tendons and muscle.  Technique: Use a cream to massage with, as it insures a smooth gliding motion and avoids irritation  caused by rubbing skin to skin.  Cream is preferred over a lotion.   May begin as soon as any suture areas are healed.   Apply a firm, steady  pressure with your finger-tip, pulling the skin in a circular motion over the scarred area.  Do not rub.  Message 10-15 minutes, at least 4-6 times a day, unless you are getting tender afterwards.  Extension (Active With Finger Flexion)    With fingers curled, bend hand back at wrist. Hold ____ seconds. Repeat ____ times. Do ____ sessions per day.  Wrist Flexion / Extension    Hold elbows at 90 and close to body, with palms down. Bend both wrists so fingers point up. Then bend wrist so fingers point down. Repeat sequence ____ times per session. Do ____ sessions per week. Hand Variation: Thumbs up   Flexor Tendon Gliding (Active Full Fist)    Straighten all fingers, then make a fist, bending all joints. Repeat ____ times. Do ____ sessions per day.  Radial / Ulnar Deviation (Assistive)    With palm and wrist flat on table, slide hand side to side like a windshield wiper. Do not move elbow. Repeat ____ times. Do ____ sessions per day.

## 2019-12-07 ENCOUNTER — Ambulatory Visit: Payer: Self-pay | Admitting: Occupational Therapy

## 2019-12-09 ENCOUNTER — Ambulatory Visit: Payer: Self-pay | Admitting: Occupational Therapy

## 2019-12-09 ENCOUNTER — Other Ambulatory Visit: Payer: Self-pay

## 2019-12-09 DIAGNOSIS — R278 Other lack of coordination: Secondary | ICD-10-CM

## 2019-12-09 DIAGNOSIS — M6281 Muscle weakness (generalized): Secondary | ICD-10-CM

## 2019-12-09 DIAGNOSIS — M25641 Stiffness of right hand, not elsewhere classified: Secondary | ICD-10-CM

## 2019-12-09 NOTE — Therapy (Signed)
[facility] [address] [place], [place], [address] Phone: [phone]   Fax:  [phone]  Occupational Therapy Treatment  Patient Details  Name: Connor Brennan MRN: [id number] Date of Birth: 04/08/1990 Referring Provider (OT): Dr Griffin Basil, PA-C   Encounter Date: 12/09/2019   OT End of Session - 12/09/19 1059    Visit Number 2    Number of Visits 10    Date for OT Re-Evaluation 02/04/20    Authorization Type Self pay    OT Start Time 1015    OT Stop Time 1055    OT Time Calculation (min) 40 min    Activity Tolerance Patient tolerated treatment well    Behavior During Therapy [facility] for tasks assessed/performed           Past Medical History:  Diagnosis Date  . Asthma     Past Surgical History:  Procedure Laterality Date  . AV FISTULA PLACEMENT Right 10/28/2019   Procedure: Ligation of Transected Right Radial Artery;  Surgeon: Larina Earthly, MD;  Location: [facility] OR;  Service: Vascular;  Laterality: Right;  . HAND SURGERY Right   . TENDON REPAIR Right 10/28/2019   Procedure: Right Forearm Laceration with Tendon Involvement, Right Forearm Flexor  Carpiradialis Tendon Repair, Right Forearm Flexor Digitorium Superficialis Repair Traumatic Laceration Repair 8cm;  Surgeon: Bradly Bienenstock, MD;  Location: [facility] OR;  Service: Orthopedics;  Laterality: Right;    There were no vitals filed for this visit.   Subjective Assessment - 12/09/19 1019    Subjective  Pt is s/p R FCR, FDS, artery and laceration repair on 10/28/2019. He presents today for protective splinting per Dr Melvyn Novas. Pt fell down sisters stairs and arm went through window resulting in forearm laceration.    Pertinent History s/p Rt FCR, FDS tendon repair zone V and artery repair 10/28/19    Patient Stated Goals Get use of hand and forearm back.  Pt with pain in certain positions or with certain movement    Currently in Pain? No/denies           Adjusted splint proximally at forearm for greater comfort and added foam liner.  Ultrasound x 8 min, 20% pulsed, 3 Mhz, 1.0 wts/cm2 for scar management. Pt shown/reviewed scar massage to break up scar tissue and prevent hypersensitivity.  Reviewed A/ROM HEP - Pt demo each x 10 reps. Pt's A/ROM WFL's and therefore did not need P/ROM at this time, however did show pt extrinsic stretch in supination, and isolated finger flexion for tendon gliding. Pt also encouraged to keep fingers and thumb moving, but cautioned not to do any lifting or pushing up through RUE at this time.                         OT Short Term Goals - 12/09/19 1059      OT SHORT TERM GOAL #1   Title Pt will be Mod I splinting use, care and precautions R UE.    Baseline VC's    Time 4    Period Weeks    Status On-going    Target Date 12/31/19      OT SHORT TERM GOAL #2   Title Pt will be Mod I scar management and massage as seen in clinic setting    Baseline Min vc's and tc's    Time 4    Period Weeks    Status On-going    Target Date 12/31/19  OT SHORT TERM GOAL #3   Title Pt will be Mod I desensitization R volar forearm scar as observed in clinic    Baseline dependent    Time 4    Period Weeks    Status On-going    Target Date 12/31/19             OT Long Term Goals - 12/03/19 1232      OT LONG TERM GOAL #1   Title Pt will be Mod I updated HEP R UE for active and PROM as observed in clinic    Baseline unable    Time 8    Period Weeks    Status New    Target Date 01/28/20      OT LONG TERM GOAL #2   Title Pt will be Mod I strengthening R hand as seen by ability to perform theraputty ex's in clinic    Baseline Unable    Time 8    Period Weeks    Status New    Target Date 01/28/20      OT LONG TERM GOAL #3   Title Pt will demonstrate functional grip of 15# or greater and coordination R dominant hand as seen by JAMAR and 9 hole peg test assessment    Baseline Unable     Time 8    Period Weeks    Status New    Target Date 01/28/20                 Plan - 12/09/19 1100    Clinical Impression Statement Pt progressing nicely with A/ROM Rt wrist and hand with no limitations.    OT Occupational Profile and History Problem Focused Assessment - Including review of records relating to presenting problem    Occupational performance deficits (Please refer to evaluation for details): ADL's;IADL's;Work    Body Structure / Function / Physical Skills ADL;Strength;Dexterity;Edema;Pain;UE functional use;ROM;Scar mobility;Coordination;Flexibility;Sensation;FMC;Skin integrity;Decreased knowledge of precautions    Rehab Potential Good    Clinical Decision Making Limited treatment options, no task modification necessary    Comorbidities Affecting Occupational Performance: None    Modification or Assistance to Complete Evaluation  No modification of tasks or assist necessary to complete eval    OT Frequency Other (comment)   1x/week for 4 weeks followed by 2x/week for 3 weeks   OT Duration Other (comment)   See above   OT Treatment/Interventions Self-care/ADL training;Fluidtherapy;Splinting;Therapeutic activities;Ultrasound;Therapeutic exercise;Scar mobilization;Passive range of motion;Paraffin;Manual Therapy;Patient/family education    Plan continue [place], ROM, begin light strengthening for wrist and hand    Consulted and Agree with Plan of Care Patient           Patient will benefit from skilled therapeutic intervention in order to improve the following deficits and impairments:   Body Structure / Function / Physical Skills: ADL, Strength, Dexterity, Edema, Pain, UE functional use, ROM, Scar mobility, Coordination, Flexibility, Sensation, FMC, Skin integrity, Decreased knowledge of precautions       Visit Diagnosis: Stiffness of right hand, not elsewhere classified  Other lack of coordination  Muscle weakness (generalized)    Problem List Patient Active  Problem List   Diagnosis Date Noted  . Laceration of right arm with complication 10/28/2019  . Pes planus, flexible 10/06/2019    Kelli Churn, OTR/L 12/09/2019, 11:02 AM  [facility] [address] [place], [place], [address] Phone: [phone]   Fax:  [phone]  Name: Connor Brennan MRN: [id number] Date of Birth: 1990/09/07

## 2019-12-16 ENCOUNTER — Encounter: Payer: Self-pay | Admitting: *Deleted

## 2019-12-24 ENCOUNTER — Ambulatory Visit: Payer: Self-pay | Attending: Physician Assistant | Admitting: Occupational Therapy

## 2019-12-30 ENCOUNTER — Ambulatory Visit: Payer: Self-pay | Admitting: Occupational Therapy

## 2020-01-06 ENCOUNTER — Ambulatory Visit: Payer: Self-pay | Admitting: Occupational Therapy

## 2021-03-12 ENCOUNTER — Emergency Department (HOSPITAL_COMMUNITY)
Admission: EM | Admit: 2021-03-12 | Discharge: 2021-03-12 | Disposition: A | Discharge status: Home / Self Care | Payer: Self-pay | Attending: Emergency Medicine | Admitting: Emergency Medicine

## 2021-03-12 ENCOUNTER — Other Ambulatory Visit: Payer: Self-pay

## 2021-03-12 ENCOUNTER — Encounter (HOSPITAL_COMMUNITY): Payer: Self-pay

## 2021-03-12 ENCOUNTER — Emergency Department (HOSPITAL_COMMUNITY): Payer: Self-pay

## 2021-03-12 DIAGNOSIS — F17210 Nicotine dependence, cigarettes, uncomplicated: Secondary | ICD-10-CM | POA: Insufficient documentation

## 2021-03-12 DIAGNOSIS — S0990XA Unspecified injury of head, initial encounter: Secondary | ICD-10-CM

## 2021-03-12 DIAGNOSIS — W228XXA Striking against or struck by other objects, initial encounter: Secondary | ICD-10-CM | POA: Insufficient documentation

## 2021-03-12 DIAGNOSIS — S0101XA Laceration without foreign body of scalp, initial encounter: Secondary | ICD-10-CM | POA: Insufficient documentation

## 2021-03-12 DIAGNOSIS — J45909 Unspecified asthma, uncomplicated: Secondary | ICD-10-CM | POA: Insufficient documentation

## 2021-03-12 DIAGNOSIS — Z23 Encounter for immunization: Secondary | ICD-10-CM | POA: Insufficient documentation

## 2021-03-12 MED ORDER — OXYCODONE-ACETAMINOPHEN 5-325 MG PO TABS
1.0000 | ORAL_TABLET | Freq: Once | ORAL | Status: AC
  Administered 2021-03-12: 12:00:00 1 via ORAL
  Filled 2021-03-12: qty 1

## 2021-03-12 MED ORDER — TETANUS-DIPHTH-ACELL PERTUSSIS 5-2.5-18.5 LF-MCG/0.5 IM SUSY
0.5000 mL | PREFILLED_SYRINGE | Freq: Once | INTRAMUSCULAR | Status: AC
  Administered 2021-03-12: 12:00:00 0.5 mL via INTRAMUSCULAR
  Filled 2021-03-12: qty 0.5
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
## 2021-03-12 NOTE — Discharge Instructions (Addendum)
You had a cut on the back of your head that needed staples.  The staples will need to be removed in 10 days.  You can return to the ER, or an urgent care, or your doctors office to have these removed.  Keep your hair dry for the next 2 days.  After that you can shower normally.  Your CT scan did not show signs of brain bleeding or injury or fracture of the skull.  You got a tetanus booster shot in the ER today.  This is good for 10 years.

## 2021-03-12 NOTE — ED Notes (Signed)
RN irrigated wound with NS per MD request. Suture cart at bedside.

## 2021-03-12 NOTE — ED Notes (Signed)
Pt verbalized understanding of d/c instructions, meds and followup care. Denies questions. VSS, no distress noted. Steady gait to exit with all belongings.  ?
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
## 2021-03-22 ENCOUNTER — Ambulatory Visit (HOSPITAL_COMMUNITY)
Admission: EM | Admit: 2021-03-22 | Discharge: 2021-03-22 | Disposition: A | Discharge status: Home / Self Care | Payer: Self-pay

## 2021-03-22 ENCOUNTER — Other Ambulatory Visit: Payer: Self-pay

## 2021-03-22 NOTE — ED Triage Notes (Signed)
Pt reports for staple removal that were placed on Christmas Day.   Pt states he does not have concerns.

## 2021-07-03 ENCOUNTER — Encounter: Payer: Self-pay | Admitting: Nurse Practitioner

## 2021-07-03 ENCOUNTER — Ambulatory Visit (INDEPENDENT_AMBULATORY_CARE_PROVIDER_SITE_OTHER): Payer: Self-pay | Admitting: Nurse Practitioner

## 2021-07-03 VITALS — BP 103/82 | HR 84 | Temp 98.4°F | Ht 73.0 in | Wt 205.6 lb

## 2021-07-03 DIAGNOSIS — R82998 Other abnormal findings in urine: Secondary | ICD-10-CM

## 2021-07-03 DIAGNOSIS — R1010 Upper abdominal pain, unspecified: Secondary | ICD-10-CM

## 2021-07-03 DIAGNOSIS — Z0000 Encounter for general adult medical examination without abnormal findings: Secondary | ICD-10-CM

## 2021-07-03 LAB — POCT URINALYSIS DIP (CLINITEK)
Bilirubin, UA: NEGATIVE
Blood, UA: NEGATIVE
Glucose, UA: NEGATIVE mg/dL
Ketones, POC UA: NEGATIVE mg/dL
Nitrite, UA: NEGATIVE
POC PROTEIN,UA: NEGATIVE
Spec Grav, UA: 1.025 (ref 1.010–1.025)
Urobilinogen, UA: 2 E.U./dL — AB
pH, UA: 6 (ref 5.0–8.0)

## 2021-07-03 MED ORDER — PANTOPRAZOLE SODIUM 40 MG PO TBEC: 40.0000 mg | DELAYED_RELEASE_TABLET | Freq: Every day | ORAL | 3 refills | Status: DC

## 2021-07-03 NOTE — Patient Instructions (Signed)
You were seen today in the [facility] for abdominal pain. Labs were collected, results will be available via MyChart or, if abnormal, you will be contacted by clinic staff. You were prescribed medications, please take as directed. Please follow up in 3 mths for reevaluation of abdominal pain. ?

## 2021-07-03 NOTE — Progress Notes (Signed)
? ?[facility] ?[address] ?[place], [place]  [address] ?Phone:  [phone]   Fax:  [phone] ?Subjective:  ? Patient ID: Connor Brennan, male    DOB: Aug 01, 1990, 31 y.o.   MRN: [id number] ? ?Chief Complaint  ?Patient presents with  ? Establish Care  ?  Patient is here today to establish care and to discuss the stomach pain he has been having x 2 month. Patient states that the pains radiate to his left lower back and is worse in the morning. Patient also has nausea with the stomach pains as well with constipation and diarrhea.   ? ?HPI ?Connor Brennan 31 y.o. male with no significant medical history to the [facility] to establish care and for evaluation of  abdominal pain x 2 mths. Last visit with PCP unknown.  ? ?Patient states that abdominal pain is localized to the diffuse upper abdomen and radiates throughout. Describes pain as sharp and cramping. Sometimes pain improves with bowel movement, symptoms worsen after meals. Has not taken any medications for symptoms or been evaluated. Has constipation, diarrhea and a few episodes of nausea with vomiting. Denies any blood in stool or emesis. Denies having similar symptoms in the past. ? ?Endorses drinking 2-3 beers per day and smoking marijuana intermittently throughout the day.  ? 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 Have five children, but they live away from home. Denies any other concerns today. ? ?Denies any fatigue, chest pain, shortness of breath, HA or dizziness. Denies any blurred vision, numbness or tingling. ? ? ?Past Medical History:  ?Diagnosis Date  ? Asthma   ? ? ?Past Surgical History:  ?Procedure Laterality Date  ? AV FISTULA PLACEMENT Right 10/28/2019  ? Procedure: Ligation of Transected Right Radial Artery;  Surgeon: Larina Earthly, MD;  Location: [facility] OR;  Service: Vascular;  Laterality: Right;  ? HAND SURGERY Right   ? TENDON REPAIR Right 10/28/2019  ? Procedure: Right Forearm Laceration with Tendon  Involvement, Right Forearm Flexor  Carpiradialis Tendon Repair, Right Forearm Flexor Digitorium Superficialis Repair Traumatic Laceration Repair 8cm;  Surgeon: Bradly Bienenstock, MD;  Location: [facility] OR;  Service: Orthopedics;  Laterality: Right;  ? ? ?Family History  ?Problem Relation Age of Onset  ? Healthy Mother   ? Healthy Father   ? ? ?Social History  ? ?Socioeconomic History  ? Marital status: Significant Other  ?  Spouse name: Not on file  ? Number of children: Not on file  ? Years of education: Not on file  ? Highest education level: Not on file  ?Occupational History  ? Not on file  ?Tobacco Use  ? Smoking status: Every Day  ?  Packs/day: 0.50  ?  Types: Cigarettes  ? Smokeless tobacco: Never  ?Vaping Use  ? Vaping Use: Never used  ?Substance and Sexual Activity  ? Alcohol use: Yes  ?  Comment: social  ? Drug use: Yes  ?  Types: Marijuana  ? Sexual activity: Yes  ?  Birth control/protection: None  ?  Comment: Married  ?Other Topics Concern  ? Not on file  ?Social History Narrative  ? Not on file  ? ?Social Determinants of Health  ? ?Financial Resource Strain: Not on file  ?Food Insecurity: Not on file  ?Transportation Needs: Not on file  ?Physical Activity: Not on file  ?Stress: Not on file  ?Social Connections: Not on file  ?Intimate Partner Violence: Not on file  ? ? ?Outpatient  Medications Prior to Visit  ?Medication Sig Dispense Refill  ? cephALEXin (KEFLEX) 500 MG capsule Take 2 capsules (1,000 mg total) by mouth 2 (two) times daily. (Patient not taking: Reported on 10/28/2019) 20 capsule 0  ? famotidine (PEPCID) 20 MG tablet Take 1 tablet (20 mg total) by mouth 2 (two) times daily. (Patient not taking: Reported on 10/28/2019) 30 tablet 0  ? ibuprofen (ADVIL,MOTRIN) 600 MG tablet Take 1 tablet (600 mg total) by mouth every 6 (six) hours as needed. (Patient not taking: Reported on 10/28/2019) 30 tablet 0  ? predniSONE (STERAPRED UNI-PAK 21 TAB) 10 MG (21) TBPK tablet Take by mouth daily. Take as directed.  (Patient not taking: Reported on 10/28/2019) 21 tablet 0  ? ?No facility-administered medications prior to visit.  ? ? ?Allergies  ?Allergen Reactions  ? Shellfish Allergy Swelling and Other (See Comments)  ?  Face swells  ? ? ?Review of Systems  ?Constitutional:  Negative for chills, fever and malaise/fatigue.  ?HENT: Negative.    ?Eyes: Negative.   ?Respiratory:  Negative for cough and shortness of breath.   ?Cardiovascular:  Negative for chest pain, palpitations and leg swelling.  ?Gastrointestinal:  Positive for abdominal pain, constipation, diarrhea, nausea and vomiting. Negative for blood in stool.  ?Genitourinary: Negative.   ?Musculoskeletal: Negative.   ?Skin: Negative.   ?Neurological: Negative.   ?Psychiatric/Behavioral:  Negative for depression. The patient is not nervous/anxious.   ?All other systems reviewed and are negative. ? ?   ?Objective:  ?  ?Physical Exam ?Vitals reviewed.  ?Constitutional:   ?   General: He is not in acute distress. ?   Appearance: Normal appearance.  ?HENT:  ?   Head: Normocephalic.  ?   Right Ear: Tympanic membrane, ear canal and external ear normal. There is no impacted cerumen.  ?   Left Ear: Tympanic membrane, ear canal and external ear normal. There is no impacted cerumen.  ?   Nose: Nose normal. No congestion or rhinorrhea.  ?   Mouth/Throat:  ?   Mouth: Mucous membranes are moist.  ?   Pharynx: Oropharynx is clear. No oropharyngeal exudate or posterior oropharyngeal erythema.  ?Eyes:  ?   General: No scleral icterus.    ?   Right eye: No discharge.     ?   Left eye: No discharge.  ?   Extraocular Movements: Extraocular movements intact.  ?   Conjunctiva/sclera: Conjunctivae normal.  ?   Pupils: Pupils are equal, round, and reactive to light.  ?Neck:  ?   Vascular: No carotid bruit.  ?Cardiovascular:  ?   Rate and Rhythm: Normal rate and regular rhythm.  ?   Pulses: Normal pulses.  ?   Heart sounds: Normal heart sounds.  ?   Comments: No obvious peripheral  edema ?Pulmonary:  ?   Effort: Pulmonary effort is normal.  ?   Breath sounds: Normal breath sounds.  ?Abdominal:  ?   General: Abdomen is flat. Bowel sounds are normal. There is no distension.  ?   Palpations: Abdomen is soft. There is no mass.  ?   Tenderness: There is no abdominal tenderness. There is no right CVA tenderness, left CVA tenderness, guarding or rebound.  ?   Hernia: No hernia is present.  ?Musculoskeletal:     ?   General: No swelling, tenderness, deformity or signs of injury. Normal range of motion.  ?   Cervical back: Normal range of motion and neck supple. No rigidity or tenderness.  ?  Right lower leg: No edema.  ?   Left lower leg: No edema.  ?Lymphadenopathy:  ?   Cervical: No cervical adenopathy.  ?Skin: ?   General: Skin is warm and dry.  ?   Capillary Refill: Capillary refill takes less than 2 seconds.  ?Neurological:  ?   General: No focal deficit present.  ?   Mental Status: He is alert and oriented to person, place, and time.  ?Psychiatric:     ?   Mood and Affect: Mood normal.     ?   Behavior: Behavior normal.     ?   Thought Content: Thought content normal.     ?   Judgment: Judgment normal.  ? ? ?BP 103/82   Pulse 84   Temp 98.4 ?F (36.9 ?C)   Ht 6\' 1"  (1.854 m)   Wt 205 lb 9.6 oz (93.3 kg)   SpO2 99%   BMI 27.13 kg/m?  ?Wt Readings from Last 3 Encounters:  ?07/03/21 205 lb 9.6 oz (93.3 kg)  ?03/12/21 180 lb (81.6 kg)  ?10/06/19 180 lb (81.6 kg)  ? ? ?Immunization History  ?Administered Date(s) Administered  ? Tdap 10/26/2015, 09/27/2017, 03/12/2021  ? ? ?Diabetic Foot Exam - Simple   ?No data filed ?  ? ? ?Lab Results  ?Component Value Date  ? TSH 0.939 12/01/2009  ? ?Lab Results  ?Component Value Date  ? WBC 7.6 10/28/2019  ? HGB 14.1 10/28/2019  ? HCT 42.2 10/28/2019  ? MCV 95.3 10/28/2019  ? PLT 314 10/28/2019  ? ?Lab Results  ?Component Value Date  ? NA 141 10/28/2019  ? K 4.9 10/28/2019  ? CO2 22 10/28/2019  ? GLUCOSE 116 (H) 10/28/2019  ? BUN 6 10/28/2019  ? CREATININE  1.19 10/28/2019  ? BILITOT 0.5 09/03/2019  ? ALKPHOS 42 09/03/2019  ? AST 45 (H) 09/03/2019  ? ALT 44 09/03/2019  ? PROT 6.3 (L) 09/03/2019  ? ALBUMIN 3.9 09/03/2019  ? CALCIUM 8.9 10/28/2019  ? ANIONGAP 9 10/28/2019  ? ?No resu

## 2021-07-04 LAB — COMPREHENSIVE METABOLIC PANEL
ALT: 21 IU/L (ref 0–44)
AST: 19 IU/L (ref 0–40)
Albumin/Globulin Ratio: 1.7 (ref 1.2–2.2)
Albumin: 4.5 g/dL (ref 4.0–5.0)
Alkaline Phosphatase: 86 IU/L (ref 44–121)
BUN/Creatinine Ratio: 11 (ref 9–20)
BUN: 16 mg/dL (ref 6–20)
Bilirubin Total: 0.4 mg/dL (ref 0.0–1.2)
CO2: 21 mmol/L (ref 20–29)
Calcium: 9.5 mg/dL (ref 8.7–10.2)
Chloride: 104 mmol/L (ref 96–106)
Creatinine, Ser: 1.46 mg/dL — ABNORMAL HIGH (ref 0.76–1.27)
Globulin, Total: 2.7 g/dL (ref 1.5–4.5)
Glucose: 91 mg/dL (ref 70–99)
Potassium: 4.6 mmol/L (ref 3.5–5.2)
Sodium: 138 mmol/L (ref 134–144)
Total Protein: 7.2 g/dL (ref 6.0–8.5)
eGFR: 65 mL/min/{1.73_m2} (ref >59)

## 2021-07-04 LAB — CBC WITH DIFFERENTIAL/PLATELET
Basophils Absolute: 0.1 10*3/uL (ref 0.0–0.2)
Basos: 1 %
EOS (ABSOLUTE): 0.1 10*3/uL (ref 0.0–0.4)
Eos: 2 %
Hematocrit: 48.7 % (ref 37.5–51.0)
Hemoglobin: 16.4 g/dL (ref 13.0–17.7)
Immature Grans (Abs): 0 10*3/uL (ref 0.0–0.1)
Immature Granulocytes: 0 %
Lymphocytes Absolute: 2 10*3/uL (ref 0.7–3.1)
Lymphs: 22 %
MCH: 30.6 pg (ref 26.6–33.0)
MCHC: 33.7 g/dL (ref 31.5–35.7)
MCV: 91 fL (ref 79–97)
Monocytes Absolute: 0.7 10*3/uL (ref 0.1–0.9)
Monocytes: 8 %
Neutrophils Absolute: 6.3 10*3/uL (ref 1.4–7.0)
Neutrophils: 67 %
Platelets: 339 10*3/uL (ref 150–450)
RBC: 5.36 x10E6/uL (ref 4.14–5.80)
RDW: 14.3 % (ref 11.6–15.4)
WBC: 9.3 10*3/uL (ref 3.4–10.8)

## 2021-07-04 LAB — LIPID PANEL
Chol/HDL Ratio: 2.4 ratio (ref 0.0–5.0)
Cholesterol, Total: 168 mg/dL (ref 100–199)
HDL: 69 mg/dL (ref >39)
LDL Chol Calc (NIH): 65 mg/dL (ref 0–99)
Triglycerides: 208 mg/dL — ABNORMAL HIGH (ref 0–149)
VLDL Cholesterol Cal: 34 mg/dL (ref 5–40)

## 2021-07-04 LAB — HEMOGLOBIN A1C
Est. average glucose Bld gHb Est-mCnc: 111 mg/dL
Hgb A1c MFr Bld: 5.5 % (ref 4.8–5.6)

## 2021-07-04 LAB — LIPASE: Lipase: 23 U/L (ref 13–78)

## 2021-07-05 LAB — H. PYLORI BREATH TEST: H pylori Breath Test: POSITIVE — AB

## 2021-07-06 LAB — URINE CULTURE

## 2021-07-07 ENCOUNTER — Other Ambulatory Visit: Payer: Self-pay | Admitting: Nurse Practitioner

## 2021-07-07 DIAGNOSIS — A048 Other specified bacterial intestinal infections: Secondary | ICD-10-CM

## 2021-07-07 MED ORDER — PEPTO-BISMOL 524 MG/30ML PO SUSP: 30.0000 mL | Freq: Four times a day (QID) | ORAL | 2 refills | Status: AC | PRN

## 2021-07-07 MED ORDER — METRONIDAZOLE 500 MG PO TABS: 500.0000 mg | ORAL_TABLET | Freq: Three times a day (TID) | ORAL | 0 refills | Status: AC

## 2021-07-07 MED ORDER — TETRACYCLINE HCL 500 MG PO CAPS: 500.0000 mg | ORAL_CAPSULE | Freq: Four times a day (QID) | ORAL | 0 refills | Status: AC

## 2021-07-19 ENCOUNTER — Telehealth: Payer: Self-pay

## 2021-07-19 NOTE — Telephone Encounter (Signed)
tetracycline (SUMYCIN) 500 MG capsule ? ?Prior authorization was started on 07/19/21 ?

## 2021-08-03 ENCOUNTER — Ambulatory Visit (HOSPITAL_COMMUNITY)
Admission: RE | Admit: 2021-08-03 | Discharge: 2021-08-03 | Disposition: A | Discharge status: Home / Self Care | Payer: Self-pay | Source: Ambulatory Visit | Attending: Nurse Practitioner | Admitting: Nurse Practitioner

## 2021-08-03 DIAGNOSIS — R1010 Upper abdominal pain, unspecified: Secondary | ICD-10-CM | POA: Insufficient documentation

## 2021-08-03 MED ORDER — SODIUM CHLORIDE (PF) 0.9 % IJ SOLN
INTRAMUSCULAR | Status: AC
  Filled 2021-08-03: qty 50

## 2021-08-03 MED ORDER — IOHEXOL 300 MG/ML  SOLN
100.0000 mL | Freq: Once | INTRAMUSCULAR | Status: AC | PRN
  Administered 2021-08-03: 100 mL via INTRAVENOUS

## 2021-08-17 ENCOUNTER — Encounter: Payer: Self-pay | Admitting: Gastroenterology

## 2021-09-15 ENCOUNTER — Ambulatory Visit (INDEPENDENT_AMBULATORY_CARE_PROVIDER_SITE_OTHER): Payer: Self-pay | Admitting: Gastroenterology

## 2021-09-15 ENCOUNTER — Encounter: Payer: Self-pay | Admitting: Gastroenterology

## 2021-09-15 VITALS — BP 130/82 | HR 88 | Ht 72.0 in | Wt 205.0 lb

## 2021-09-15 DIAGNOSIS — A048 Other specified bacterial intestinal infections: Secondary | ICD-10-CM

## 2021-09-15 DIAGNOSIS — R1011 Right upper quadrant pain: Secondary | ICD-10-CM

## 2021-09-15 MED ORDER — AMOXICILLIN 500 MG PO CAPS: 1000.0000 mg | ORAL_CAPSULE | Freq: Two times a day (BID) | ORAL | 0 refills | Status: AC

## 2021-09-15 MED ORDER — OMEPRAZOLE 20 MG PO CPDR: 20.0000 mg | DELAYED_RELEASE_CAPSULE | Freq: Two times a day (BID) | ORAL | 0 refills | Status: DC

## 2021-09-15 MED ORDER — BISMUTH 262 MG PO CHEW: 262.0000 mg | CHEWABLE_TABLET | Freq: Four times a day (QID) | ORAL | 0 refills | Status: AC

## 2021-09-15 MED ORDER — METRONIDAZOLE 250 MG PO TABS: 250.0000 mg | ORAL_TABLET | Freq: Four times a day (QID) | ORAL | 0 refills | Status: AC

## 2021-09-15 NOTE — Patient Instructions (Signed)
If you are age 33 or older, your body mass index should be between 23-30. Your Body mass index is 27.8 kg/m. If this is out of the aforementioned range listed, please consider follow up with your Primary Care Provider.  If you are age 11 or younger, your body mass index should be between 19-25. Your Body mass index is 27.8 kg/m. If this is out of the aformentioned range listed, please consider follow up with your Primary Care Provider.   Your provider has requested that you go to the basement level for lab work before leaving today. Press "B" on the elevator. The lab is located at the first door on the left as you exit the elevator.  We have sent the following medications to your pharmacy for you to pick up at your convenience: Omeprazole 20 mg twice daily for 14 days , Bismuth 262 mg two tablets four times daily for 14 days , Metronidazole 250 mg every 6 hours for 14 days , Amoxicillin 1000 mg twice daily for 14 days.   The [facility] providers would like to encourage you to use [facility] to communicate with providers for non-urgent requests or questions.  Due to long hold times on the telephone, sending your provider a message by [facility] may be a faster and more efficient way to get a response.  Please allow 48 business hours for a response.  It was a pleasure to see you today!  Thank you for trusting me with your gastrointestinal care!    Scott E.Tomasa Rand, MD

## 2021-09-15 NOTE — Progress Notes (Unsigned)
HPI : 31 yearold male with no significant past medical history is referred to [place] by Orion Crook, NP for further evaluation of abdominal pain.  The patient states that he has had problems with abdominal pain for a few months now.  The pain is located in the right upper quadrant pain and is crampy in nature.  It is sometimes associated with nausea, but no vomiting.  It typically lasts about 10 minutes.  It is not experienced on a daily basis.  It is noticed more often in the mornings upon awakening although it also sometimes awakens him from sleep. It is sometimes improved with eating and it is not usually worsened with eating.   He also seems to notice it more with laying on his right side. He gets heartburn as well, and does think that is heartburn tends to occur with his abdominal pain.   His appetite is good and his weight is stable.  He has regular bowel movements with formed brown stools daily.  No problems with constipation, diarrhea, hematochezia or melena. He was found to have a positive H. Pylori breath test in April and was prescribed quadruple therapy, but the patient states that it was going to cost him $800 so he never took it. He never took the Protonix that was prescribed.  A CT abdomen/pelvis in May was unremarkable.  Past Medical History:  Diagnosis Date   Asthma      Past Surgical History:  Procedure Laterality Date   AV FISTULA PLACEMENT Right 10/28/2019   Procedure: Ligation of Transected Right Radial Artery;  Surgeon: Larina Earthly, MD;  Location: [facility] OR;  Service: Vascular;  Laterality: Right;   HAND SURGERY Right    TENDON REPAIR Right 10/28/2019   Procedure: Right Forearm Laceration with Tendon Involvement, Right Forearm Flexor  Carpiradialis Tendon Repair, Right Forearm Flexor Digitorium Superficialis Repair Traumatic Laceration Repair 8cm;  Surgeon: Bradly Bienenstock, MD;  Location: [facility] OR;  Service: Orthopedics;  Laterality: Right;   Family History  Problem Relation  Age of Onset   Healthy Mother    Healthy Father    Social History   Tobacco Use   Smoking status: Every Day    Packs/day: 0.50    Types: Cigarettes   Smokeless tobacco: Never  Vaping Use   Vaping Use: Never used  Substance Use Topics   Alcohol use: Yes    Comment: social   Drug use: Yes    Types: Marijuana   Current Outpatient Medications  Medication Sig Dispense Refill   pantoprazole (PROTONIX) 40 MG tablet Take 1 tablet (40 mg total) by mouth daily. 30 tablet 3   No current facility-administered medications for this visit.   Allergies  Allergen Reactions   Shellfish Allergy Swelling and Other (See Comments)    Face swells     Review of Systems: All systems reviewed and negative except where noted in HPI.    No results found.  Physical Exam: BP 130/82   Pulse 88   Ht 6' (1.829 m)   Wt 205 lb (93 kg)   SpO2 97%   BMI 27.80 kg/m  Constitutional: Pleasant,well-developed, [name] in no acute distress.  Accompanied by spouse. HEENT: Normocephalic and atraumatic. Conjunctivae are normal. No scleral icterus. Neck supple.  Cardiovascular: Normal rate, regular rhythm.  Pulmonary/chest: Effort normal and breath sounds normal. No wheezing, rales or rhonchi. Abdominal: Soft, nondistended, nontender. Bowel sounds active throughout. There are no masses palpable. No hepatomegaly. Extremities: no edema Neurological: Alert  and oriented to person place and time. Skin: Skin is warm and dry. No rashes noted. Psychiatric: Normal mood and affect. Behavior is normal.  CBC    Component Value Date/Time   WBC 9.3 07/03/2021 1539   WBC 7.6 10/28/2019 1946   RBC 5.36 07/03/2021 1539   RBC 4.43 10/28/2019 1946   HGB 16.4 07/03/2021 1539   HCT 48.7 07/03/2021 1539   PLT 339 07/03/2021 1539   MCV 91 07/03/2021 1539   MCH 30.6 07/03/2021 1539   MCH 31.8 10/28/2019 1946   MCHC 33.7 07/03/2021 1539   MCHC 33.4 10/28/2019 1946   RDW 14.3 07/03/2021 1539    LYMPHSABS 2.0 07/03/2021 1539   MONOABS 0.5 10/28/2019 1946   EOSABS 0.1 07/03/2021 1539   BASOSABS 0.1 07/03/2021 1539    CMP     Component Value Date/Time   NA 138 07/03/2021 1539   K 4.6 07/03/2021 1539   CL 104 07/03/2021 1539   CO2 21 07/03/2021 1539   GLUCOSE 91 07/03/2021 1539   GLUCOSE 116 (H) 10/28/2019 1946   BUN 16 07/03/2021 1539   CREATININE 1.46 (H) 07/03/2021 1539   CALCIUM 9.5 07/03/2021 1539   PROT 7.2 07/03/2021 1539   ALBUMIN 4.5 07/03/2021 1539   AST 19 07/03/2021 1539   ALT 21 07/03/2021 1539   ALKPHOS 86 07/03/2021 1539   BILITOT 0.4 07/03/2021 1539   GFRNONAA >60 10/28/2019 1946   GFRAA >60 10/28/2019 1946   CLINICAL DATA:  Abdominal pain   EXAM: CT ABDOMEN AND PELVIS WITH CONTRAST   TECHNIQUE: Multidetector CT imaging of the abdomen and pelvis was performed using the standard protocol following bolus administration of intravenous contrast.   RADIATION DOSE REDUCTION: This exam was performed according to the departmental dose-optimization program which includes automated exposure control, adjustment of the mA and/or kV according to patient size and/or use of iterative reconstruction technique.   CONTRAST:  OMNIPAQUE IOHEXOL 300 MG/ML  SOLN   COMPARISON:  None Available.   FINDINGS: Lower chest: No acute abnormality.   Hepatobiliary: Liver is normal in size and contour with no suspicious mass identified. Gallbladder appears normal. No biliary ductal dilatation.   Pancreas: Unremarkable. No pancreatic ductal dilatation or surrounding inflammatory changes.   Spleen: Normal in size without focal abnormality.   Adrenals/Urinary Tract: Adrenal glands are unremarkable. Kidneys are normal, without renal calculi, focal lesion, or hydronephrosis. Bladder is unremarkable.   Stomach/Bowel: Stomach is within normal limits. Appendix appears normal. No evidence of bowel wall thickening, distention, or inflammatory changes.    Vascular/Lymphatic: No significant vascular findings are present. No enlarged abdominal or pelvic lymph nodes.   Reproductive: Prostate is unremarkable.   Other: No ascites.  Tiny umbilical hernia containing fat.   Musculoskeletal: No acute or significant osseous findings.   IMPRESSION: No acute process identified in the abdomen or pelvis.     Electronically Signed   By: Jannifer Hick M.D.   On: 08/04/2021 09:09   ASSESSMENT AND PLAN: 31 year old male with several months of RUQ abdominal pain and heartburn, with occasional nausea, no vomiting, not typically worsened with eating, and positive H. Pylori breath test, not treated due to reported expense of treatment.  Low suspicion for other etiologies of abdominal pain at this time.  Suspect patient's symptoms are secondary to H. Pylori infection.  Will re-enter prescription for quadruple therapy, but will substitute amoxicillin for tetracycline and substitute omeprazole for Protonix. (Hopefully this will bring cost down). Will treat with following  regimen:  1) Omeprazole 20 mg 2 times a day x 14 d 2) Pepto Bismol 2 tabs (262 mg each) 4 times a day x 14 d 3) Metronidazole 250 mg 4 times a day x 14 d 4) amoxicillin 1000 mg 2 times a day x 14 d  After 14 days, ok to stop omeprazole.  4 weeks after treatment completed, check H. Pylori stool antigen to confirm eradication (must be off acid suppression therapy for 2 weeks prior to specimen submission)  If symptoms not improved after confirmed eradication of H pylori, will consider further evaluation to include RUQUS and EGD.  Connor Tiller E. Tomasa Rand, MD [facility]   CC:  Kathrynn Speed, NP

## 2021-10-02 ENCOUNTER — Ambulatory Visit: Payer: Medicaid Other | Admitting: Nurse Practitioner

## 2021-10-09 ENCOUNTER — Ambulatory Visit: Payer: Medicaid Other | Admitting: Nurse Practitioner

## 2021-10-25 ENCOUNTER — Ambulatory Visit: Payer: Medicaid Other | Admitting: Nurse Practitioner

## 2021-11-09 ENCOUNTER — Ambulatory Visit: Payer: Medicaid Other | Admitting: Nurse Practitioner

## 2021-11-16 ENCOUNTER — Ambulatory Visit (INDEPENDENT_AMBULATORY_CARE_PROVIDER_SITE_OTHER): Payer: Self-pay | Admitting: Nurse Practitioner

## 2021-11-16 ENCOUNTER — Encounter: Payer: Self-pay | Admitting: Nurse Practitioner

## 2021-11-16 VITALS — BP 124/79 | HR 84 | Temp 98.4°F | Ht 73.0 in | Wt 202.0 lb

## 2021-11-16 DIAGNOSIS — M25552 Pain in left hip: Secondary | ICD-10-CM

## 2021-11-16 MED ORDER — PREDNISONE 20 MG PO TABS: 20.0000 mg | ORAL_TABLET | Freq: Every day | ORAL | 0 refills | Status: AC

## 2021-11-16 MED ORDER — TIZANIDINE HCL 4 MG PO TABS: 4.0000 mg | ORAL_TABLET | Freq: Four times a day (QID) | ORAL | 0 refills | Status: DC | PRN

## 2021-11-16 NOTE — Progress Notes (Signed)
@Patient  ID: , male    DOB: April 05, 1990, 31 y.o.   MRN: [id number]  Chief Complaint  Patient presents with   Follow-up    Pt  states he has pain from his LT side hip down to his calf pt states both leg has sharp pain pt has been having this issue for 1 month    Referring provider: No ref. provider found  HPI  [id number] 31 y.o. male with no significant medical history   Patient presents today for left hip pain.  He states that the left hip radiates down left leg when going from sitting - feels tightness and pulling.  He states that this has been going on for about a month now.  We will order x-ray.  We will trial prednisone and muscle relaxer. Denies f/c/s, n/v/d, hemoptysis, PND, leg swelling Denies chest pain or edema     Allergies  Allergen Reactions   Shellfish Allergy Swelling and Other (See Comments)    Face swells    Immunization History  Administered Date(s) Administered   Tdap 10/26/2015, 09/27/2017, 03/12/2021    Past Medical History:  Diagnosis Date   Asthma     Tobacco History: Social History   Tobacco Use  Smoking Status Every Day   Packs/day: 0.50   Types: Cigarettes  Smokeless Tobacco Never   Ready to quit: Not Answered Counseling given: Not Answered   Outpatient Encounter Medications as of 11/16/2021  Medication Sig   predniSONE (DELTASONE) 20 MG tablet Take 1 tablet (20 mg total) by mouth daily with breakfast for 5 days.   tiZANidine (ZANAFLEX) 4 MG tablet Take 1 tablet (4 mg total) by mouth every 6 (six) hours as needed for muscle spasms.   omeprazole (PRILOSEC) 20 MG capsule Take 1 capsule (20 mg total) by mouth 2 (two) times daily for 14 days.   pantoprazole (PROTONIX) 40 MG tablet Take 1 tablet (40 mg total) by mouth daily. (Patient not taking: Reported on 09/15/2021)   No facility-administered encounter medications on file as of 11/16/2021.     Review of Systems  Review of Systems  Constitutional: Negative.   HENT:  Negative.    Cardiovascular: Negative.   Gastrointestinal: Negative.   Musculoskeletal:  Positive for arthralgias and myalgias.       Left hip pain  Allergic/Immunologic: Negative.   Neurological: Negative.   Psychiatric/Behavioral: Negative.         Physical Exam  BP 124/79 (BP Location: Right Arm, Patient Position: Sitting, Cuff Size: Large)   Pulse 84   Temp 98.4 F (36.9 C)   Ht 6\' 1"  (1.854 m)   Wt 202 lb (91.6 kg)   SpO2 100%   BMI 26.65 kg/m   Wt Readings from Last 5 Encounters:  11/16/21 202 lb (91.6 kg)  09/15/21 205 lb (93 kg)  07/03/21 205 lb 9.6 oz (93.3 kg)  03/12/21 180 lb (81.6 kg)  10/06/19 180 lb (81.6 kg)     Physical Exam Vitals and nursing note reviewed.  Constitutional:      General: He is not in acute distress.    Appearance: He is well-developed.  Cardiovascular:     Rate and Rhythm: Normal rate and regular rhythm.  Pulmonary:     Effort: Pulmonary effort is normal.     Breath sounds: Normal breath sounds.  Skin:    General: Skin is warm and dry.  Neurological:     Mental Status: He is alert and oriented to person, place, and time.  Lab Results:  CBC    Component Value Date/Time   WBC 9.3 07/03/2021 1539   WBC 7.6 10/28/2019 1946   RBC 5.36 07/03/2021 1539   RBC 4.43 10/28/2019 1946   HGB 16.4 07/03/2021 1539   HCT 48.7 07/03/2021 1539   PLT 339 07/03/2021 1539   MCV 91 07/03/2021 1539   MCH 30.6 07/03/2021 1539   MCH 31.8 10/28/2019 1946   MCHC 33.7 07/03/2021 1539   MCHC 33.4 10/28/2019 1946   RDW 14.3 07/03/2021 1539   LYMPHSABS 2.0 07/03/2021 1539   MONOABS 0.5 10/28/2019 1946   EOSABS 0.1 07/03/2021 1539   BASOSABS 0.1 07/03/2021 1539    BMET    Component Value Date/Time   NA 138 07/03/2021 1539   K 4.6 07/03/2021 1539   CL 104 07/03/2021 1539   CO2 21 07/03/2021 1539   GLUCOSE 91 07/03/2021 1539   GLUCOSE 116 (H) 10/28/2019 1946   BUN 16 07/03/2021 1539   CREATININE 1.46 (H) 07/03/2021 1539    CALCIUM 9.5 07/03/2021 1539   GFRNONAA >60 10/28/2019 1946   GFRAA >60 10/28/2019 1946      Assessment & Plan:   Left hip pain - DG Hip Unilat W OR W/O Pelvis 2-3 Views Left; Future - predniSONE (DELTASONE) 20 MG tablet; Take 1 tablet (20 mg total) by mouth daily with breakfast for 5 days.  Dispense: 5 tablet; Refill: 0 - tiZANidine (ZANAFLEX) 4 MG tablet; Take 1 tablet (4 mg total) by mouth every 6 (six) hours as needed for muscle spasms.  Dispense: 30 tablet; Refill: 0  Follow up:  Follow up in 4-6 weeks or sooner if needed  Patient Instructions  1. Left hip pain  - DG Hip Unilat W OR W/O Pelvis 2-3 Views Left; Future - predniSONE (DELTASONE) 20 MG tablet; Take 1 tablet (20 mg total) by mouth daily with breakfast for 5 days.  Dispense: 5 tablet; Refill: 0 - tiZANidine (ZANAFLEX) 4 MG tablet; Take 1 tablet (4 mg total) by mouth every 6 (six) hours as needed for muscle spasms.  Dispense: 30 tablet; Refill: 0  Follow up:  Follow up in 4-6 weeks or sooner if needed   Hip Pain:  The hip is the joint between the upper legs and the lower pelvis. The bones, cartilage, tendons, and muscles of your hip joint support your body and allow you to move around. Hip pain can range from a minor ache to severe pain in one or both of your hips. The pain may be felt on the inside of the hip joint near the groin, or on the outside near the buttocks and upper thigh. You may also have swelling or stiffness in your hip area. Follow these instructions at home:  Managing pain, stiffness, and swelling     If directed, put ice on the painful area. To do this: Put ice in a plastic bag. Place a towel between your skin and the bag. Leave the ice on for 20 minutes, 2-3 times a day. If directed, apply heat to the affected area as often as told by your health care provider. Use the heat source that your health care provider recommends, such as a moist heat pack or a heating pad. Place a towel between  your skin and the heat source. Leave the heat on for 20-30 minutes. Remove the heat if your skin turns bright red. This is especially important if you are unable to feel pain, heat, or cold. You may have a greater risk  of getting burned. Activity Do exercises as told by your health care provider. Avoid activities that cause pain. General instructions  Take over-the-counter and prescription medicines only as told by your health care provider. Keep a journal of your symptoms. Write down: How often you have hip pain. The location of your pain. What the pain feels like. What makes the pain worse. Sleep with a pillow between your legs on your most comfortable side. Keep all follow-up visits as told by your health care provider. This is important. Contact a health care provider if: You cannot put weight on your leg. Your pain or swelling continues or gets worse after one week. It gets harder to walk. You have a fever. Get help right away if: You fall. You have a sudden increase in pain and swelling in your hip. Your hip is red or swollen or very tender to touch. Summary Hip pain can range from a minor ache to severe pain in one or both of your hips. The pain may be felt on the inside of the hip joint near the groin, or on the outside near the buttocks and upper thigh. Avoid activities that cause pain. Write down how often you have hip pain, the location of the pain, what makes it worse, and what it feels like. This information is not intended to replace advice given to you by your health care provider. Make sure you discuss any questions you have with your health care provider. Document Revised: 07/21/2018 Document Reviewed: 07/21/2018 Elsevier Patient Education  [address].      Ivonne Andrew, [place] 11/17/2021

## 2021-11-16 NOTE — Patient Instructions (Addendum)
1. Left hip pain  - DG Hip Unilat W OR W/O Pelvis 2-3 Views Left; Future - predniSONE (DELTASONE) 20 MG tablet; Take 1 tablet (20 mg total) by mouth daily with breakfast for 5 days.  Dispense: 5 tablet; Refill: 0 - tiZANidine (ZANAFLEX) 4 MG tablet; Take 1 tablet (4 mg total) by mouth every 6 (six) hours as needed for muscle spasms.  Dispense: 30 tablet; Refill: 0  Follow up:  Follow up in 4-6 weeks or sooner if needed   Hip Pain:  The hip is the joint between the upper legs and the lower pelvis. The bones, cartilage, tendons, and muscles of your hip joint support your body and allow you to move around. Hip pain can range from a minor ache to severe pain in one or both of your hips. The pain may be felt on the inside of the hip joint near the groin, or on the outside near the buttocks and upper thigh. You may also have swelling or stiffness in your hip area. Follow these instructions at home:  Managing pain, stiffness, and swelling     If directed, put ice on the painful area. To do this: Put ice in a plastic bag. Place a towel between your skin and the bag. Leave the ice on for 20 minutes, 2-3 times a day. If directed, apply heat to the affected area as often as told by your health care provider. Use the heat source that your health care provider recommends, such as a moist heat pack or a heating pad. Place a towel between your skin and the heat source. Leave the heat on for 20-30 minutes. Remove the heat if your skin turns bright red. This is especially important if you are unable to feel pain, heat, or cold. You may have a greater risk of getting burned. Activity Do exercises as told by your health care provider. Avoid activities that cause pain. General instructions  Take over-the-counter and prescription medicines only as told by your health care provider. Keep a journal of your symptoms. Write down: How often you have hip pain. The location of your pain. What the pain  feels like. What makes the pain worse. Sleep with a pillow between your legs on your most comfortable side. Keep all follow-up visits as told by your health care provider. This is important. Contact a health care provider if: You cannot put weight on your leg. Your pain or swelling continues or gets worse after one week. It gets harder to walk. You have a fever. Get help right away if: You fall. You have a sudden increase in pain and swelling in your hip. Your hip is red or swollen or very tender to touch. Summary Hip pain can range from a minor ache to severe pain in one or both of your hips. The pain may be felt on the inside of the hip joint near the groin, or on the outside near the buttocks and upper thigh. Avoid activities that cause pain. Write down how often you have hip pain, the location of the pain, what makes it worse, and what it feels like. This information is not intended to replace advice given to you by your health care provider. Make sure you discuss any questions you have with your health care provider. Document Revised: 07/21/2018 Document Reviewed: 07/21/2018 Elsevier Patient Education  2023 [employer].

## 2021-11-17 DIAGNOSIS — M25552 Pain in left hip: Secondary | ICD-10-CM | POA: Insufficient documentation

## 2021-11-17 NOTE — Assessment & Plan Note (Signed)
-   DG Hip Unilat W OR W/O Pelvis 2-3 Views Left; Future - predniSONE (DELTASONE) 20 MG tablet; Take 1 tablet (20 mg total) by mouth daily with breakfast for 5 days.  Dispense: 5 tablet; Refill: 0 - tiZANidine (ZANAFLEX) 4 MG tablet; Take 1 tablet (4 mg total) by mouth every 6 (six) hours as needed for muscle spasms.  Dispense: 30 tablet; Refill: 0  Follow up:  Follow up in 4-6 weeks or sooner if needed

## 2021-11-28 ENCOUNTER — Ambulatory Visit (HOSPITAL_COMMUNITY)
Admission: RE | Admit: 2021-11-28 | Discharge: 2021-11-28 | Disposition: A | Discharge status: Home / Self Care | Payer: Medicaid Other | Source: Ambulatory Visit | Attending: Nurse Practitioner | Admitting: Nurse Practitioner

## 2021-11-28 DIAGNOSIS — M25552 Pain in left hip: Secondary | ICD-10-CM | POA: Insufficient documentation

## 2021-11-28 NOTE — ED Notes (Signed)
Pt checked in to ED for outpatient hip x-ray. Orders confirmed in Chart Review. Pt states he does not wish to see provider, only to obtain outpatient imaging and follow up with NP. Pt redirected to check in counter to have encounter dismissed and registered as outpatient. Radiology department notified.

## 2021-12-21 ENCOUNTER — Ambulatory Visit: Payer: Self-pay | Admitting: Nurse Practitioner

## 2021-12-28 ENCOUNTER — Ambulatory Visit: Payer: Self-pay | Admitting: Nurse Practitioner

## 2022-05-24 ENCOUNTER — Ambulatory Visit (INDEPENDENT_AMBULATORY_CARE_PROVIDER_SITE_OTHER): Payer: 59 | Admitting: Nurse Practitioner

## 2022-05-24 ENCOUNTER — Encounter: Payer: Self-pay | Admitting: Nurse Practitioner

## 2022-05-24 VITALS — BP 126/87 | HR 76 | Temp 98.4°F | Ht 72.0 in | Wt 201.4 lb

## 2022-05-24 DIAGNOSIS — B001 Herpesviral vesicular dermatitis: Secondary | ICD-10-CM | POA: Diagnosis not present

## 2022-05-24 MED ORDER — ACYCLOVIR 5 % EX OINT: 1.0000 | TOPICAL_OINTMENT | CUTANEOUS | 2 refills | Status: DC

## 2022-05-24 MED ORDER — VALACYCLOVIR HCL 1 G PO TABS: 2000.0000 mg | ORAL_TABLET | Freq: Two times a day (BID) | ORAL | 0 refills | Status: AC

## 2022-05-24 NOTE — Patient Instructions (Signed)
1. Cold sore  - acyclovir ointment (ZOVIRAX) 5 %; Apply 1 Application topically every 3 (three) hours.  Dispense: 5 g; Refill: 2 - valACYclovir (VALTREX) 1000 MG tablet; Take 2 tablets (2,000 mg total) by mouth 2 (two) times daily for 1 day.  Dispense: 4 tablet; Refill: 0  Follow up:  Follow up as scheduled

## 2022-05-24 NOTE — Assessment & Plan Note (Signed)
-   acyclovir ointment (ZOVIRAX) 5 %; Apply 1 Application topically every 3 (three) hours.  Dispense: 5 g; Refill: 2 - valACYclovir (VALTREX) 1000 MG tablet; Take 2 tablets (2,000 mg total) by mouth 2 (two) times daily for 1 day.  Dispense: 4 tablet; Refill: 0  Follow up:  Follow up as scheduled

## 2022-05-24 NOTE — Progress Notes (Signed)
$'@Patient'f$  ID: Connor Brennan, male    DOB: 09-Dec-1990, 32 y.o.   MRN: [id number]  Chief Complaint  Patient presents with   Mass    Bump on pt lip is swollen  started Saturday .    Referring provider: Fenton Foy, NP   HPI  Patient presents today for blister to his lip.  The blister is located to the left lower lip.  He states that he has been picking at the area and made it worse.  We discussed that this is consistent with cold sore and will treat with Valtrex.  Will also order acyclovir cream but concerned about cost for that.  Patient may use over-the-counter cold sore creams. Denies f/c/s, n/v/d, hemoptysis, PND, leg swelling Denies chest pain or edema       Allergies  Allergen Reactions   Shellfish Allergy Swelling and Other (See Comments)    Face swells    Immunization History  Administered Date(s) Administered   Tdap 10/26/2015, 09/27/2017, 03/12/2021    Past Medical History:  Diagnosis Date   Asthma     Tobacco History: Social History   Tobacco Use  Smoking Status Every Day   Packs/day: 0.50   Types: Cigarettes  Smokeless Tobacco Never   Ready to quit: Not Answered Counseling given: Not Answered   Outpatient Encounter Medications as of 05/24/2022  Medication Sig   acyclovir ointment (ZOVIRAX) 5 % Apply 1 Application topically every 3 (three) hours.   valACYclovir (VALTREX) 1000 MG tablet Take 2 tablets (2,000 mg total) by mouth 2 (two) times daily for 1 day.   omeprazole (PRILOSEC) 20 MG capsule Take 1 capsule (20 mg total) by mouth 2 (two) times daily for 14 days.   pantoprazole (PROTONIX) 40 MG tablet Take 1 tablet (40 mg total) by mouth daily. (Patient not taking: Reported on 09/15/2021)   tiZANidine (ZANAFLEX) 4 MG tablet Take 1 tablet (4 mg total) by mouth every 6 (six) hours as needed for muscle spasms. (Patient not taking: Reported on 05/24/2022)   No facility-administered encounter medications on file as of 05/24/2022.     Review of  Systems  Review of Systems  Constitutional: Negative.   HENT: Negative.         Blister left lower lip  Cardiovascular: Negative.   Gastrointestinal: Negative.   Allergic/Immunologic: Negative.   Neurological: Negative.   Psychiatric/Behavioral: Negative.         Physical Exam  BP 126/87   Pulse 76   Temp 98.4 F (36.9 C)   Ht 6' (1.829 m)   Wt 201 lb 6.4 oz (91.4 kg)   SpO2 100%   BMI 27.31 kg/m   Wt Readings from Last 5 Encounters:  05/24/22 201 lb 6.4 oz (91.4 kg)  11/16/21 202 lb (91.6 kg)  09/15/21 205 lb (93 kg)  07/03/21 205 lb 9.6 oz (93.3 kg)  03/12/21 180 lb (81.6 kg)     Physical Exam Vitals and nursing note reviewed.  Constitutional:      General: He is not in acute distress.    Appearance: He is well-developed.  HENT:     Head:      Comments: Cold sore noted to left lower lip Cardiovascular:     Rate and Rhythm: Normal rate and regular rhythm.  Pulmonary:     Effort: Pulmonary effort is normal.     Breath sounds: Normal breath sounds.  Skin:    General: Skin is warm and dry.  Neurological:     Mental  Status: He is alert and oriented to person, place, and time.      Lab Results:  CBC    Component Value Date/Time   WBC 9.3 07/03/2021 1539   WBC 7.6 10/28/2019 1946   RBC 5.36 07/03/2021 1539   RBC 4.43 10/28/2019 1946   HGB 16.4 07/03/2021 1539   HCT 48.7 07/03/2021 1539   PLT 339 07/03/2021 1539   MCV 91 07/03/2021 1539   MCH 30.6 07/03/2021 1539   MCH 31.8 10/28/2019 1946   MCHC 33.7 07/03/2021 1539   MCHC 33.4 10/28/2019 1946   RDW 14.3 07/03/2021 1539   LYMPHSABS 2.0 07/03/2021 1539   MONOABS 0.5 10/28/2019 1946   EOSABS 0.1 07/03/2021 1539   BASOSABS 0.1 07/03/2021 1539    BMET    Component Value Date/Time   NA 138 07/03/2021 1539   K 4.6 07/03/2021 1539   CL 104 07/03/2021 1539   CO2 21 07/03/2021 1539   GLUCOSE 91 07/03/2021 1539   GLUCOSE 116 (H) 10/28/2019 1946   BUN 16 07/03/2021 1539   CREATININE 1.46 (H)  07/03/2021 1539   CALCIUM 9.5 07/03/2021 1539   GFRNONAA >60 10/28/2019 1946   GFRAA >60 10/28/2019 1946     Assessment & Plan:   Cold sore - acyclovir ointment (ZOVIRAX) 5 %; Apply 1 Application topically every 3 (three) hours.  Dispense: 5 g; Refill: 2 - valACYclovir (VALTREX) 1000 MG tablet; Take 2 tablets (2,000 mg total) by mouth 2 (two) times daily for 1 day.  Dispense: 4 tablet; Refill: 0  Follow up:  Follow up as scheduled     Fenton Foy, NP 05/24/2022

## 2022-05-29 ENCOUNTER — Encounter (HOSPITAL_COMMUNITY): Payer: Self-pay

## 2022-05-29 ENCOUNTER — Emergency Department (HOSPITAL_COMMUNITY)
Admission: EM | Admit: 2022-05-29 | Discharge: 2022-05-29 | Disposition: A | Discharge status: Home / Self Care | Payer: Self-pay | Attending: Emergency Medicine | Admitting: Emergency Medicine

## 2022-05-29 ENCOUNTER — Emergency Department (HOSPITAL_COMMUNITY): Payer: Self-pay

## 2022-05-29 ENCOUNTER — Other Ambulatory Visit: Payer: Self-pay

## 2022-05-29 DIAGNOSIS — K529 Noninfective gastroenteritis and colitis, unspecified: Secondary | ICD-10-CM | POA: Insufficient documentation

## 2022-05-29 DIAGNOSIS — J45909 Unspecified asthma, uncomplicated: Secondary | ICD-10-CM | POA: Insufficient documentation

## 2022-05-29 DIAGNOSIS — B349 Viral infection, unspecified: Secondary | ICD-10-CM | POA: Insufficient documentation

## 2022-05-29 DIAGNOSIS — Z1152 Encounter for screening for COVID-19: Secondary | ICD-10-CM | POA: Insufficient documentation

## 2022-05-29 DIAGNOSIS — R079 Chest pain, unspecified: Secondary | ICD-10-CM | POA: Insufficient documentation

## 2022-05-29 LAB — HEPATIC FUNCTION PANEL
ALT: 26 U/L (ref 0–44)
AST: 28 U/L (ref 15–41)
Albumin: 4 g/dL (ref 3.5–5.0)
Alkaline Phosphatase: 48 U/L (ref 38–126)
Bilirubin, Direct: 0.2 mg/dL (ref 0.0–0.2)
Indirect Bilirubin: 0.8 mg/dL (ref 0.3–0.9)
Total Bilirubin: 1 mg/dL (ref 0.3–1.2)
Total Protein: 6.7 g/dL (ref 6.5–8.1)

## 2022-05-29 LAB — BASIC METABOLIC PANEL
Anion gap: 6 (ref 5–15)
BUN: 12 mg/dL (ref 6–20)
CO2: 24 mmol/L (ref 22–32)
Calcium: 9 mg/dL (ref 8.9–10.3)
Chloride: 107 mmol/L (ref 98–111)
Creatinine, Ser: 1.22 mg/dL (ref 0.61–1.24)
GFR, Estimated: >60 mL/min (ref >60)
Glucose, Bld: 101 mg/dL — ABNORMAL HIGH (ref 70–99)
Potassium: 3.7 mmol/L (ref 3.5–5.1)
Sodium: 137 mmol/L (ref 135–145)

## 2022-05-29 LAB — TROPONIN I (HIGH SENSITIVITY): Troponin I (High Sensitivity): <2 ng/L (ref <18)

## 2022-05-29 LAB — RESP PANEL BY RT-PCR (RSV, FLU A&B, COVID)  RVPGX2
Influenza A by PCR: NEGATIVE
Influenza B by PCR: NEGATIVE
Resp Syncytial Virus by PCR: NEGATIVE
SARS Coronavirus 2 by RT PCR: NEGATIVE

## 2022-05-29 LAB — LIPASE, BLOOD: Lipase: 34 U/L (ref 11–51)

## 2022-05-29 MED ORDER — ACETAMINOPHEN 325 MG PO TABS
650.0000 mg | ORAL_TABLET | Freq: Once | ORAL | Status: AC
  Administered 2022-05-29: 650 mg via ORAL
  Filled 2022-05-29: qty 2

## 2022-05-29 MED ORDER — ONDANSETRON HCL 4 MG PO TABS: 4.0000 mg | ORAL_TABLET | Freq: Four times a day (QID) | ORAL | 0 refills | Status: DC

## 2022-05-29 MED ORDER — ONDANSETRON 4 MG PO TBDP
4.0000 mg | ORAL_TABLET | Freq: Once | ORAL | Status: AC
  Administered 2022-05-29: 4 mg via ORAL
  Filled 2022-05-29: qty 1

## 2022-05-29 NOTE — ED Provider Notes (Signed)
[facility] Provider Note   CSN: [id number] Arrival date & time: 05/29/22  1249     History  Chief Complaint  Patient presents with   Shortness of Breath    Connor Brennan is a 32 y.o. male.  Patient is a 32 year old male with no significant past medical history that presented to the emergency department with chest tightness and cough.  He states for the last 2 days he has had intermittent pain with taking deep breaths and has been coughing with some mild mucus production.  He states he has associated nausea and diarrhea.  He denies significant abdominal pain.  He denies any fevers.  His significant other was sick with similar symptoms a few weeks ago.  He denies any history of blood clots, recent hospitalizations or surgeries, recent long travels in the car or plane, hormone use or cancer history.  The history is provided by the patient and the spouse.  Shortness of Breath      Home Medications Prior to Admission medications   Medication Sig Start Date End Date Taking? Authorizing Provider  ondansetron (ZOFRAN) 4 MG tablet Take 1 tablet (4 mg total) by mouth every 6 (six) hours. 05/29/22  Yes Leanord Asal K, DO  acyclovir ointment (ZOVIRAX) 5 % Apply 1 Application topically every 3 (three) hours. 05/24/22   Fenton Foy, NP  omeprazole (PRILOSEC) 20 MG capsule Take 1 capsule (20 mg total) by mouth 2 (two) times daily for 14 days. 09/15/21 09/29/21  Daryel November, MD  pantoprazole (PROTONIX) 40 MG tablet Take 1 tablet (40 mg total) by mouth daily. Patient not taking: Reported on 09/15/2021 07/03/21   Bo Merino I, NP  tiZANidine (ZANAFLEX) 4 MG tablet Take 1 tablet (4 mg total) by mouth every 6 (six) hours as needed for muscle spasms. Patient not taking: Reported on 05/24/2022 11/16/21   Fenton Foy, NP      Allergies    Shellfish allergy    Review of Systems   Review of Systems  Respiratory:  Positive for  shortness of breath.     Physical Exam Updated Vital Signs BP 131/73   Pulse (!) 56   Temp 98.4 F (36.9 C) (Oral)   Resp 16   Ht 6' (1.829 m)   Wt 93 kg   SpO2 98%   BMI 27.80 kg/m  Physical Exam Vitals and nursing note reviewed.  Constitutional:      General: He is not in acute distress.    Appearance: He is well-developed.  HENT:     Head: Normocephalic and atraumatic.     Mouth/Throat:     Mouth: Mucous membranes are moist.     Pharynx: Oropharynx is clear.  Eyes:     Extraocular Movements: Extraocular movements intact.  Cardiovascular:     Rate and Rhythm: Normal rate and regular rhythm.     Heart sounds: Normal heart sounds.  Pulmonary:     Effort: Pulmonary effort is normal.     Breath sounds: Normal breath sounds.  Chest:     Chest wall: No tenderness.  Abdominal:     Palpations: Abdomen is soft.     Tenderness: There is abdominal tenderness (epigastric). There is no guarding or rebound.  Musculoskeletal:        General: Normal range of motion.     Cervical back: Normal range of motion and neck supple.     Right lower leg: No edema.  Left lower leg: No edema.  Skin:    General: Skin is warm and dry.  Neurological:     General: No focal deficit present.     Mental Status: He is alert and oriented to person, place, and time.  Psychiatric:        Mood and Affect: Mood normal.        Behavior: Behavior normal.     ED Results / Procedures / Treatments   Labs (all labs ordered are listed, but only abnormal results are displayed) Labs Reviewed  BASIC METABOLIC PANEL - Abnormal; Notable for the following components:      Result Value   Glucose, Bld 101 (*)    All other components within normal limits  RESP PANEL BY RT-PCR (RSV, FLU A&B, COVID)  [id number]  HEPATIC FUNCTION PANEL  LIPASE, BLOOD  TROPONIN I (HIGH SENSITIVITY)    EKG EKG Interpretation  Date/Time:  Tuesday May 29 2022 13:04:45 EDT Ventricular Rate:  69 PR Interval:  141 QRS  Duration: 93 QT Interval:  368 QTC Calculation: 395 R Axis:   62 Text Interpretation: Sinus rhythm Minimal ST elevation, anterior leads No significant change since last tracing Confirmed by Leanord Asal ([id number]) on 05/29/2022 6:15:31 PM  Radiology DG Chest 2 View  Result Date: 05/29/2022 CLINICAL DATA:  Shortness of EXAM: CHEST - 2 VIEW COMPARISON:  CXR 04/04/12 FINDINGS: The heart size and mediastinal contours are within normal limits. Both lungs are clear. The visualized skeletal structures are unremarkable. IMPRESSION: No active cardiopulmonary disease. Electronically Signed   By: Marin Roberts M.D.   On: 05/29/2022 13:36    Procedures Procedures    Medications Ordered in ED Medications  acetaminophen (TYLENOL) tablet 650 mg (650 mg Oral Given 05/29/22 1834)  ondansetron (ZOFRAN-ODT) disintegrating tablet 4 mg (4 mg Oral Given 05/29/22 1834)    ED Course/ Medical Decision Making/ A&P Clinical Course as of 05/29/22 2008  Tue May 29, 2022  2006 Workup within normal range. Patient is stable for discharge home with PCP follow up. [VK]    Clinical Course User Index [VK] Kemper Durie, DO                             Medical Decision Making This patient presents to the ED with chief complaint(s) of chest pain, cough, nausea with no pertinent past medical history which further complicates the presenting complaint. The complaint involves an extensive differential diagnosis and also carries with it a high risk of complications and morbidity.    The differential diagnosis includes pneumonia, pneumothorax, pulmonary edema, pleural effusion, ACS, arrhythmia, anemia, electrolyte abnormality, gastritis, GERD, pancreatitis, hepatitis, viral syndrome, dehydration  Additional history obtained: Additional history obtained from family Records reviewed N/A  ED Course and Reassessment: Patient was initially evaluated by provider in triage and had EKG, labs and chest x-ray performed.  EKG  showed normal sinus rhythm without signs of ischemia and troponin was negative.  His chest pains been ongoing for several days so single troponin is sufficient.  He will additionally have LFTs and lipase and a viral swab performed.  He is PERC negative making PE unlikely he was given Zofran for nausea and Tylenol for pain will be closely reassessed.  Independent labs interpretation:  The following labs were independently interpreted: within normal range  Independent visualization of imaging: - I independently visualized the following imaging with scope of interpretation limited to determining acute life threatening conditions related to  emergency care: CXR, which revealed no acute disease  Consultation: - Consulted or discussed management/test interpretation w/ external professional: N/A  Consideration for admission or further workup: Patient has no emergent conditions requiring admission or further work-up at this time and is stable for discharge home with primary care follow-up  Social Determinants of health: n/a    Amount and/or Complexity of Data Reviewed Labs: ordered.  Risk OTC drugs. Prescription drug management.          Final Clinical Impression(s) / ED Diagnoses Final diagnoses:  Viral syndrome  Chest pain, unspecified type  Gastroenteritis    Rx / DC Orders ED Discharge Orders          Ordered    ondansetron (ZOFRAN) 4 MG tablet  Every 6 hours        05/29/22 2007              Kemper Durie, DO 05/29/22 2008

## 2022-05-29 NOTE — ED Triage Notes (Addendum)
C/o sob with inhalation, coughing, and laughing  with nausea x2 days.  Relief with laying down.  Hx asthma. Reports no home inhaler.

## 2022-05-29 NOTE — ED Provider Triage Note (Signed)
Emergency Medicine Provider Triage Evaluation Note  Cairee Lumbard , a 32 y.o. male  was evaluated in triage.  Pt complains of difficulty breathing which has been ongoing for the last couple of days. Similar episode in the past, in which he had walking pneumonia several years ago. He did not take anything for his symptoms. No other complaints reported. No prior hx of CAD.  Review of Systems  Positive: Shortness of breath Negative: Cough, fever,   Physical Exam  BP 132/78 (BP Location: Left Arm)   Pulse 63   Temp 98.4 F (36.9 C) (Oral)   Resp 18   Ht 6' (1.829 m)   Wt 93 kg   SpO2 97%   BMI 27.80 kg/m  Gen:   Awake, no distress   Resp:  Normal effort  MSK:   Moves extremities without difficulty  Other:  Lungs are diminished throughout.   Medical Decision Making  Medically screening exam initiated at 1:07 PM.  Appropriate orders placed.  Justice Deeds was informed that the remainder of the evaluation will be completed by another provider, this initial triage assessment does not replace that evaluation, and the importance of remaining in the ED until their evaluation is complete.     Janeece Fitting, PA-C 05/29/22 1313

## 2022-05-29 NOTE — Discharge Instructions (Signed)
You were seen in the emergency department for your chest pain, cough, nausea and diarrhea.  Your workup showed that you are negative for COVID, flu and RSV, you no signs of pneumonia or dehydration.  You likely of another viral syndrome causing your symptoms.  You can take Zofran as needed for nausea and Tylenol or antacid as needed for pain.  You should drink plenty of fluids and make sure you are staying well-hydrated and you can follow-up with your primary doctor to have your symptoms rechecked.  You should return to the emergency department for significantly worsening pain, repetitive vomiting despite your nausea medication or any other new or concerning symptoms.

## 2022-05-31 ENCOUNTER — Telehealth: Payer: Self-pay

## 2022-05-31 NOTE — Transitions of Care (Post Inpatient/ED Visit) (Cosign Needed)
   05/31/2022  Name: Derris Millan MRN: [id number] DOB: Sep 21, 1990  Today's TOC FU Call Status: Today's TOC FU Call Status:: Unsuccessul Call (1st Attempt) Unsuccessful Call (1st Attempt) Date: 05/31/22  Attempted to reach the patient regarding the most recent Inpatient/ED visit.  Follow Up Plan: Additional outreach attempts will be made to reach the patient to complete the Transitions of Care (Post Inpatient/ED visit) call.   [place] RMA

## 2022-06-01 ENCOUNTER — Telehealth: Payer: Self-pay

## 2022-06-01 NOTE — Transitions of Care (Post Inpatient/ED Visit) (Signed)
   06/01/2022  Name: Cristo Applin MRN: [id number] DOB: November 26, 1990  Today's TOC FU Call Status: Today's TOC FU Call Status:: Successful TOC FU Call Competed TOC FU Call Complete Date: 06/01/22  Transition Care Management Follow-up Telephone Call Discharge Facility: [facility]) Type of Discharge: Emergency Department Reason for ED Visit: Respiratory How have you been since you were released from the hospital?: Better Any questions or concerns?: No  Items Reviewed: Did you receive and understand the discharge instructions provided?: Yes Medications obtained and verified?: Yes (Medications Reviewed) Any new allergies since your discharge?: No Dietary orders reviewed?: NA Do you have support at home?: Yes People in Home: other relative(s)  Home Care and Equipment/Supplies: [place] Ordered?: NA Any new equipment or medical supplies ordered?: NA  Functional Questionnaire: Do you need assistance with bathing/showering or dressing?: No Do you need assistance with meal preparation?: No Do you need assistance with eating?: No Do you have difficulty maintaining continence: No Do you need assistance with getting out of bed/getting out of a chair/moving?: No Do you have difficulty managing or taking your medications?: No  Folllow up appointments reviewed: PCP Follow-up appointment confirmed?: Yes Date of PCP follow-up appointment?: 06/08/22 Follow-up Provider: Mable Paris Follow-up appointment confirmed?: NA Do you need transportation to your follow-up appointment?: No Do you understand care options if your condition(s) worsen?: Yes-patient verbalized understanding    [place] RMA

## 2022-06-08 ENCOUNTER — Inpatient Hospital Stay: Payer: Self-pay | Admitting: Nurse Practitioner

## 2022-09-25 ENCOUNTER — Ambulatory Visit
Admission: RE | Admit: 2022-09-25 | Discharge: 2022-09-25 | Disposition: A | Discharge status: Home / Self Care | Payer: 59 | Source: Ambulatory Visit | Attending: Internal Medicine | Admitting: Internal Medicine

## 2022-09-25 ENCOUNTER — Ambulatory Visit (INDEPENDENT_AMBULATORY_CARE_PROVIDER_SITE_OTHER): Payer: 59

## 2022-09-25 ENCOUNTER — Other Ambulatory Visit: Payer: Self-pay

## 2022-09-25 VITALS — BP 130/81 | HR 91 | Temp 98.1°F | Resp 18

## 2022-09-25 DIAGNOSIS — S50852A Superficial foreign body of left forearm, initial encounter: Secondary | ICD-10-CM | POA: Diagnosis not present

## 2022-09-25 MED ORDER — CEFDINIR 300 MG PO CAPS: 300.0000 mg | ORAL_CAPSULE | Freq: Two times a day (BID) | ORAL | 0 refills | Status: AC

## 2022-09-25 MED ORDER — HYDROCODONE-ACETAMINOPHEN 5-325 MG PO TABS: 1.0000 | ORAL_TABLET | Freq: Four times a day (QID) | ORAL | 0 refills | Status: DC | PRN
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
## 2022-09-25 NOTE — Discharge Instructions (Signed)
I have prescribed an antibiotic to treat any infection associated with this.  Follow-up orthopedic trauma specialist if symptoms persist or worsen.
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
## 2022-11-29 ENCOUNTER — Encounter (HOSPITAL_COMMUNITY): Payer: Self-pay

## 2022-11-29 ENCOUNTER — Ambulatory Visit (HOSPITAL_COMMUNITY)
Admission: EM | Admit: 2022-11-29 | Discharge: 2022-11-29 | Disposition: A | Discharge status: Home / Self Care | Payer: 59 | Attending: Emergency Medicine | Admitting: Emergency Medicine

## 2022-11-29 DIAGNOSIS — Z113 Encounter for screening for infections with a predominantly sexual mode of transmission: Secondary | ICD-10-CM | POA: Diagnosis present

## 2022-11-29 DIAGNOSIS — K219 Gastro-esophageal reflux disease without esophagitis: Secondary | ICD-10-CM | POA: Insufficient documentation

## 2022-11-29 DIAGNOSIS — R1013 Epigastric pain: Secondary | ICD-10-CM | POA: Insufficient documentation

## 2022-11-29 MED ORDER — FAMOTIDINE 20 MG PO TABS: 20.0000 mg | ORAL_TABLET | Freq: Two times a day (BID) | ORAL | 2 refills | Status: DC

## 2022-11-29 NOTE — ED Provider Notes (Signed)
[facility]    CSN: [id number] Arrival date & time: 11/29/22  1629     History   Chief Complaint Chief Complaint  Patient presents with   Abdominal Pain    HPI Connor Brennan is a 32 y.o. male.  About 2 weeks of intermittent epigastric discomfort/fullness in the morning. Does no occur every day. He notices worse after alcohol. No nausea or vomiting. Soft stools every now and then. Reports reflux history.   Has had abdominal pain on and off for a couple years. He has seen GI in the past  Requesting STD testing Denies known exposure Not having penile discharge, dysuria, rash or lesions, testicular pain or swelling.  Past Medical History:  Diagnosis Date   Asthma     Patient Active Problem List   Diagnosis Date Noted   Cold sore 05/24/2022   Left hip pain 11/17/2021   Encounter for orthopedic follow-up care 11/13/2019   Laceration of right forearm 11/13/2019   Laceration of right arm with complication 10/28/2019   Pes planus, flexible 10/06/2019    Past Surgical History:  Procedure Laterality Date   AV FISTULA PLACEMENT Right 10/28/2019   Procedure: Ligation of Transected Right Radial Artery;  Surgeon: Larina Earthly, MD;  Location: [facility] OR;  Service: Vascular;  Laterality: Right;   HAND SURGERY Right    TENDON REPAIR Right 10/28/2019   Procedure: Right Forearm Laceration with Tendon Involvement, Right Forearm Flexor  Carpiradialis Tendon Repair, Right Forearm Flexor Digitorium Superficialis Repair Traumatic Laceration Repair 8cm;  Surgeon: Bradly Bienenstock, MD;  Location: [facility] OR;  Service: Orthopedics;  Laterality: Right;       Home Medications    Prior to Admission medications   Medication Sig Start Date End Date Taking? Authorizing Provider  famotidine (PEPCID) 20 MG tablet Take 1 tablet (20 mg total) by mouth 2 (two) times daily. 11/29/22  Yes Trayshawn Durkin, Lurena Joiner, PA-C  acyclovir ointment (ZOVIRAX) 5 % Apply 1 Application topically every 3 (three) hours.  05/24/22   Ivonne Andrew, NP  HYDROcodone-acetaminophen (NORCO/VICODIN) 5-325 MG tablet Take 1 tablet by mouth every 6 (six) hours as needed for severe pain. 09/25/22   Gustavus Bryant, FNP    Family History Family History  Problem Relation Age of Onset   Healthy Mother    Healthy Father    Colon cancer Neg Hx    Stomach cancer Neg Hx    Esophageal cancer Neg Hx    Colon polyps Neg Hx     Social History Social History   Tobacco Use   Smoking status: Every Day    Current packs/day: 0.50    Types: Cigarettes   Smokeless tobacco: Never  Vaping Use   Vaping status: Never Used  Substance Use Topics   Alcohol use: Yes    Comment: social   Drug use: Yes    Types: Marijuana     Allergies   Shellfish allergy   Review of Systems Review of Systems Per HPI  Physical Exam Triage Vital Signs ED Triage Vitals [11/29/22 1759]  Encounter Vitals Group     BP 132/87     Systolic BP Percentile      Diastolic BP Percentile      Pulse Rate 74     Resp 16     Temp 98.2 F (36.8 C)     Temp Source Oral     SpO2 98 %     Weight      Height  Head Circumference      Peak Flow      Pain Score      Pain Loc      Pain Education      Exclude from Growth Chart    No data found.  Updated Vital Signs BP 132/87 (BP Location: Left Arm)   Pulse 74   Temp 98.2 F (36.8 C) (Oral)   Resp 16   SpO2 98%     Physical Exam Vitals and nursing note reviewed.  Constitutional:      Appearance: Normal appearance.  HENT:     Mouth/Throat:     Mouth: Mucous membranes are moist.     Pharynx: Oropharynx is clear.  Eyes:     Conjunctiva/sclera: Conjunctivae normal.  Cardiovascular:     Rate and Rhythm: Normal rate and regular rhythm.     Heart sounds: Normal heart sounds.  Pulmonary:     Effort: Pulmonary effort is normal.     Breath sounds: Normal breath sounds.  Abdominal:     Palpations: Abdomen is soft.     Tenderness: There is no abdominal tenderness. There is no right  CVA tenderness, left CVA tenderness, guarding or rebound.  Musculoskeletal:        General: Normal range of motion.  Skin:    General: Skin is warm and dry.  Neurological:     Mental Status: He is alert and oriented to person, place, and time.      UC Treatments / Results  Labs (all labs ordered are listed, but only abnormal results are displayed) Labs Reviewed  CYTOLOGY, (ORAL, ANAL, URETHRAL) ANCILLARY ONLY    EKG   Radiology No results found.  Procedures Procedures (including critical care time)  Medications Ordered in UC Medications - No data to display  Initial Impression / Assessment and Plan / UC Course  I have reviewed the triage vital signs and the nursing notes.  Pertinent labs & imaging results that were available during my care of the patient were reviewed by me and considered in my medical decision making (see chart for details).  Afebrile, non tender on exam, well appearing  With intermittent epigastric discomfort recommend Pepcid twice daily.  No red flags at this time.  Increase fluids, diet changes.  Follow-up with primary and GI regarding long-term symptoms.  Cytology swab pending.  Treat positive result as indicated. Patient agreeable to plan, no questions at this time  Final Clinical Impressions(s) / UC Diagnoses   Final diagnoses:  Epigastric discomfort  Gastroesophageal reflux disease without esophagitis  Screen for STD (sexually transmitted disease)     Discharge Instructions      We will call you if anything on your swab returns positive. You can also see these results on MyChart. Please abstain from sexual intercourse until your results return.  I recommend taking the Pepcid once or twice daily.  If this medicine is not covered by insurance, try the [employer] "Equate" brand which is more affordable Keep drinking lots of water!     ED Prescriptions     Medication Sig Dispense Auth. Provider   famotidine (PEPCID) 20 MG tablet Take 1  tablet (20 mg total) by mouth 2 (two) times daily. 60 tablet Bernardette Waldron, Lurena Joiner, PA-C      PDMP not reviewed this encounter.   Jayvan Mcshan, Ray Church 11/29/22 1829

## 2022-11-29 NOTE — Discharge Instructions (Signed)
We will call you if anything on your swab returns positive. You can also see these results on MyChart. Please abstain from sexual intercourse until your results return.  I recommend taking the Pepcid once or twice daily.  If this medicine is not covered by insurance, try the [employer] "Equate" brand which is more affordable Keep drinking lots of water!

## 2022-11-29 NOTE — ED Triage Notes (Signed)
Here for abnormal pain and cramping x 2 weeks. Pt reports some diarrhea. Pt would like STD- testing. Denies any symptoms.

## 2022-11-30 LAB — CYTOLOGY, (ORAL, ANAL, URETHRAL) ANCILLARY ONLY
Chlamydia: NEGATIVE
Comment: NEGATIVE
Comment: NEGATIVE
Comment: NORMAL
Neisseria Gonorrhea: NEGATIVE
Trichomonas: POSITIVE — AB

## 2022-12-01 ENCOUNTER — Telehealth: Payer: Self-pay

## 2022-12-01 MED ORDER — METRONIDAZOLE 500 MG PO TABS: 2000.0000 mg | ORAL_TABLET | Freq: Once | ORAL | 0 refills | Status: AC

## 2022-12-01 NOTE — Telephone Encounter (Signed)
Contacted patient by phone.  Verified identity using two identifiers.  Provided positive result.  Reviewed safe sex practices, notifying partners, and refraining from sexual activities for 7 days from time of treatment.  Patient verified understanding, all questions answered.     Per protocol, pt requires tx with metronidazole. Reviewed with patient, verified pharmacy, prescription sent.

## 2023-07-11 ENCOUNTER — Ambulatory Visit (HOSPITAL_COMMUNITY)
Admission: EM | Admit: 2023-07-11 | Discharge: 2023-07-11 | Disposition: A | Discharge status: Home / Self Care | Attending: Family Medicine | Admitting: Family Medicine

## 2023-07-11 ENCOUNTER — Encounter (HOSPITAL_COMMUNITY): Payer: Self-pay

## 2023-07-11 DIAGNOSIS — M25561 Pain in right knee: Secondary | ICD-10-CM | POA: Diagnosis not present

## 2023-07-11 MED ORDER — METHYLPREDNISOLONE 4 MG PO TBPK: ORAL_TABLET | ORAL | 0 refills | Status: AC

## 2023-07-11 MED ORDER — IBUPROFEN 800 MG PO TABS: 800.0000 mg | ORAL_TABLET | Freq: Three times a day (TID) | ORAL | 0 refills | Status: AC

## 2023-07-11 MED ORDER — HYDROCODONE-ACETAMINOPHEN 5-325 MG PO TABS: 1.0000 | ORAL_TABLET | Freq: Four times a day (QID) | ORAL | 0 refills | Status: AC | PRN

## 2023-07-11 NOTE — ED Triage Notes (Signed)
 Patient here today with c/o right knee pain since Sunday. Patient states that he turned the wrong way and felt a pop in his right knee. Patient states that it popped again yesterday afternoon when he was taking a step down some steps. Patient has since been having increased pain with weightbearing.   Patient also c/o right side low back pain X 2 weeks. No known injury.

## 2023-07-11 NOTE — Discharge Instructions (Signed)
 Be aware, you have been prescribed pain medications that may cause drowsiness. While taking this medication, do not take any other medications containing acetaminophen (Tylenol). Do not combine with alcohol or recreational drugs. Please do not drive, operate heavy machinery, or take part in activities that require making important decisions while on this medication as your judgement may be clouded.

## 2023-07-16 NOTE — ED Provider Notes (Signed)
 [facility]   [id number] 07/11/23 Arrival Time: 4098  ASSESSMENT & PLAN:  1. Acute pain of right knee     Discharge Medication List as of 07/11/2023  7:55 PM     START taking these medications   Details  HYDROcodone -acetaminophen  (NORCO/VICODIN) 5-325 MG tablet Take 1 tablet by mouth every 6 (six) hours as needed for moderate pain (pain score 4-6) or severe pain (pain score 7-10)., Starting Thu 07/11/2023, Normal    ibuprofen  (ADVIL ) 800 MG tablet Take 1 tablet (800 mg total) by mouth 3 (three) times daily with meals., Starting Thu 07/11/2023, Normal    methylPREDNISolone  (MEDROL  DOSEPAK) 4 MG TBPK tablet Take as directed., Normal        Orders Placed This Encounter  Procedures   Apply knee brace/sleeve    Work/school excuse note: provided. Recommend:  Follow-up Information     [facility], [facility].   Specialty: Specialist Why: If worsening or failing to improve as anticipated. Contact information: [address] [place] [address] [phone]                [place] Controlled Substances Registry consulted for this patient. I feel the risk/benefit ratio today is favorable for proceeding with this prescription for a controlled substance. Medication sedation precautions given.  Reviewed expectations re: course of current medical issues. Questions answered. Outlined signs and symptoms indicating need for more acute intervention. Patient verbalized understanding. After Visit Summary given.  SUBJECTIVE: History from: patient. Connor Brennan is a 33 y.o. male who reports R knee pain; x few days; "turned wrong and felt a pop"; painful since; worse walking down stairs; able to bear wt but painful. Mild pain in R lower back now. Normal bowel/bladder. No extremity sensation changes or weakness. OTC tx without much relief.  Past Surgical History:  Procedure Laterality Date   AV FISTULA PLACEMENT Right 10/28/2019   Procedure: Ligation of Transected Right  Radial Artery;  Surgeon: Mayo Speck, MD;  Location: [facility] OR;  Service: Vascular;  Laterality: Right;   HAND SURGERY Right    TENDON REPAIR Right 10/28/2019   Procedure: Right Forearm Laceration with Tendon Involvement, Right Forearm Flexor  Carpiradialis Tendon Repair, Right Forearm Flexor Digitorium Superficialis Repair Traumatic Laceration Repair 8cm;  Surgeon: Arvil Birks, MD;  Location: [facility] OR;  Service: Orthopedics;  Laterality: Right;      OBJECTIVE:  Vitals:   07/11/23 1944  BP: (!) 161/85  Pulse: 90  Resp: 16  Temp: 98.6 F (37 C)  TempSrc: Oral  SpO2: 96%  Weight: 86.2 kg  Height: 6' (1.829 m)    General appearance: alert; no distress HEENT: [place]; AT Neck: supple with FROM Resp: unlabored respirations Extremities: RLE: warm with well perfused appearance; no specific bony R knee TTP; without gross deformities; swelling: minimal; bruising: none; knee ROM: normal CV: brisk extremity capillary refill of RLE; 2+ DP pulse of RLE. Skin: warm and dry; no visible rashes Neurologic: gait normal; normal sensation and strength of RLE Psychological: alert and cooperative; normal mood and affect  Imaging: No results found.    Allergies  Allergen Reactions   Shellfish Allergy Swelling and Other (See Comments)    Face swells    Past Medical History:  Diagnosis Date   Asthma    Social History   Socioeconomic History   Marital status: Married    Spouse name: Not on file   Number of children: 5   Years of education: Not on file   Highest education level:  Not on file  Occupational History   Occupation: Personnel officer  Tobacco Use   Smoking status: Every Day    Current packs/day: 0.50    Types: Cigarettes   Smokeless tobacco: Never  Vaping Use   Vaping status: Never Used  Substance and Sexual Activity   Alcohol use: Yes    Comment: social   Drug use: Yes    Types: Marijuana   Sexual activity: Yes    Birth control/protection: None    Comment: Married  Other  Topics Concern   Not on file  Social History Narrative   Not on file   Social Drivers of Health   Financial Resource Strain: Not on file  Food Insecurity: Not on file  Transportation Needs: Not on file  Physical Activity: Not on file  Stress: Not on file  Social Connections: Not on file   Family History  Problem Relation Age of Onset   Healthy Mother    Healthy Father    Colon cancer Neg Hx    Stomach cancer Neg Hx    Esophageal cancer Neg Hx    Colon polyps Neg Hx    Past Surgical History:  Procedure Laterality Date   AV FISTULA PLACEMENT Right 10/28/2019   Procedure: Ligation of Transected Right Radial Artery;  Surgeon: Mayo Speck, MD;  Location: [facility] OR;  Service: Vascular;  Laterality: Right;   HAND SURGERY Right    TENDON REPAIR Right 10/28/2019   Procedure: Right Forearm Laceration with Tendon Involvement, Right Forearm Flexor  Carpiradialis Tendon Repair, Right Forearm Flexor Digitorium Superficialis Repair Traumatic Laceration Repair 8cm;  Surgeon: Arvil Birks, MD;  Location: [facility] OR;  Service: Orthopedics;  Laterality: Right;       Afton Albright, MD 07/16/23 1012

## 2023-12-09 ENCOUNTER — Ambulatory Visit (HOSPITAL_COMMUNITY)
Admission: EM | Admit: 2023-12-09 | Discharge: 2023-12-09 | Disposition: A | Discharge status: Home / Self Care | Attending: Emergency Medicine | Admitting: Emergency Medicine

## 2023-12-09 ENCOUNTER — Encounter (HOSPITAL_COMMUNITY): Payer: Self-pay | Admitting: Emergency Medicine

## 2023-12-09 DIAGNOSIS — H1132 Conjunctival hemorrhage, left eye: Secondary | ICD-10-CM

## 2023-12-09 DIAGNOSIS — S0592XA Unspecified injury of left eye and orbit, initial encounter: Secondary | ICD-10-CM

## 2023-12-09 MED ORDER — ERYTHROMYCIN 5 MG/GM OP OINT: 1.0000 | TOPICAL_OINTMENT | Freq: Four times a day (QID) | OPHTHALMIC | 0 refills | Status: AC

## 2023-12-09 MED ORDER — TETRACAINE HCL 0.5 % OP SOLN
OPHTHALMIC | Status: AC
  Filled 2023-12-09: qty 4

## 2023-12-09 NOTE — Discharge Instructions (Signed)
 Apply erythromycin  ointment 4 times daily for 5 days for infection prevention related to this injury. Follow-up with ophthalmology if you continue to have eye pain, sensitivity to light, or concerns with your eye. If you develop worsening trouble with your vision, seeing floaters or objects in your field of vision that are not there, or worsening eye pain please go to the emergency department for further evaluation.
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
## 2024-01-24 ENCOUNTER — Ambulatory Visit (HOSPITAL_COMMUNITY)

## 2024-01-24 ENCOUNTER — Ambulatory Visit (HOSPITAL_COMMUNITY)
Admission: EM | Admit: 2024-01-24 | Discharge: 2024-01-24 | Disposition: A | Discharge status: Home / Self Care | Attending: Emergency Medicine | Admitting: Emergency Medicine

## 2024-01-24 ENCOUNTER — Encounter (HOSPITAL_COMMUNITY): Payer: Self-pay

## 2024-01-24 DIAGNOSIS — R051 Acute cough: Secondary | ICD-10-CM | POA: Diagnosis not present

## 2024-01-24 DIAGNOSIS — J209 Acute bronchitis, unspecified: Secondary | ICD-10-CM

## 2024-01-24 LAB — POC COVID19/FLU A&B COMBO
Covid Antigen, POC: NEGATIVE
Influenza A Antigen, POC: NEGATIVE
Influenza B Antigen, POC: NEGATIVE

## 2024-01-24 MED ORDER — BENZONATATE 100 MG PO CAPS: 100.0000 mg | ORAL_CAPSULE | Freq: Three times a day (TID) | ORAL | 0 refills | Status: AC

## 2024-01-24 MED ORDER — ALBUTEROL SULFATE (2.5 MG/3ML) 0.083% IN NEBU
2.5000 mg | INHALATION_SOLUTION | Freq: Once | RESPIRATORY_TRACT | Status: AC
  Administered 2024-01-24: 2.5 mg via RESPIRATORY_TRACT

## 2024-01-24 MED ORDER — ALBUTEROL SULFATE HFA 108 (90 BASE) MCG/ACT IN AERS: 1.0000 | INHALATION_SPRAY | Freq: Four times a day (QID) | RESPIRATORY_TRACT | 0 refills | Status: AC | PRN

## 2024-01-24 MED ORDER — PREDNISONE 10 MG PO TABS
40.0000 mg | ORAL_TABLET | Freq: Every day | ORAL | 0 refills | Status: AC
Start: 2024-01-24 — End: 2024-01-29

## 2024-01-24 MED ORDER — AZITHROMYCIN 250 MG PO TABS: 250.0000 mg | ORAL_TABLET | Freq: Every day | ORAL | 0 refills | Status: AC

## 2024-01-24 MED ORDER — ALBUTEROL SULFATE (2.5 MG/3ML) 0.083% IN NEBU
INHALATION_SOLUTION | RESPIRATORY_TRACT | Status: AC
  Filled 2024-01-24: qty 3

## 2024-01-24 NOTE — ED Triage Notes (Signed)
 Patient presenting with cough, left side chest pain to the back, cough is productive, chills, hot flashes, SOB, and headaches. No known sick exposure. Onset of symptoms 3 days ago.   Patient has history of asthma. Tried robitussin and ibuprofen  with little relief.

## 2024-01-24 NOTE — ED Provider Notes (Signed)
 [facility]    CSN: [id number] Arrival date & time: 01/24/24  1336      History   Chief Complaint Chief Complaint  Patient presents with   Cough   Generalized Body Aches    HPI Connor Brennan is a 33 y.o. male.  Patient with past history significant for asthma presents to urgent care with concerns of cough and bodyaches.  Reports has been ongoing for the last 3 days or so.  States that he has a productive cough with green mucus.  Dors is pain to the left side of his chest with deep and elation and with coughing.  Endorses chills and subjective fevers as well as some shortness of breath and headaches.  Denies any sick contacts.  Has been using Robitussin and ibuprofen  with minimal improvement.   Cough Associated symptoms: chills     Past Medical History:  Diagnosis Date   Asthma     Patient Active Problem List   Diagnosis Date Noted   Cold sore 05/24/2022   Left hip pain 11/17/2021   Encounter for orthopedic follow-up care 11/13/2019   Laceration of right forearm 11/13/2019   Laceration of right arm with complication 10/28/2019   Pes planus, flexible 10/06/2019    Past Surgical History:  Procedure Laterality Date   AV FISTULA PLACEMENT Right 10/28/2019   Procedure: Ligation of Transected Right Radial Artery;  Surgeon: Oris Krystal FALCON, MD;  Location: [facility] OR;  Service: Vascular;  Laterality: Right;   HAND SURGERY Right    TENDON REPAIR Right 10/28/2019   Procedure: Right Forearm Laceration with Tendon Involvement, Right Forearm Flexor  Carpiradialis Tendon Repair, Right Forearm Flexor Digitorium Superficialis Repair Traumatic Laceration Repair 8cm;  Surgeon: Shari Easter, MD;  Location: [facility] OR;  Service: Orthopedics;  Laterality: Right;       Home Medications    Prior to Admission medications   Medication Sig Start Date End Date Taking? Authorizing Provider  HYDROcodone -acetaminophen  (NORCO/VICODIN) 5-325 MG tablet Take 1 tablet by mouth every 6 (six) hours  as needed for moderate pain (pain score 4-6) or severe pain (pain score 7-10). 07/11/23   Rolinda Rogue, MD  ibuprofen  (ADVIL ) 800 MG tablet Take 1 tablet (800 mg total) by mouth 3 (three) times daily with meals. 07/11/23   Rolinda Rogue, MD  methylPREDNISolone  (MEDROL  DOSEPAK) 4 MG TBPK tablet Take as directed. 07/11/23   Rolinda Rogue, MD    Family History Family History  Problem Relation Age of Onset   Healthy Mother    Healthy Father    Colon cancer Neg Hx    Stomach cancer Neg Hx    Esophageal cancer Neg Hx    Colon polyps Neg Hx     Social History Social History   Tobacco Use   Smoking status: Every Day    Current packs/day: 0.50    Types: Cigarettes   Smokeless tobacco: Never  Vaping Use   Vaping status: Never Used  Substance Use Topics   Alcohol use: Yes    Comment: social   Drug use: Yes    Types: Marijuana     Allergies   Shellfish allergy   Review of Systems Review of Systems  Constitutional:  Positive for chills.  Respiratory:  Positive for cough.   All other systems reviewed and are negative.    Physical Exam Triage Vital Signs ED Triage Vitals  Encounter Vitals Group     BP 01/24/24 1609 137/75     Girls Systolic BP Percentile --  Girls Diastolic BP Percentile --      Boys Systolic BP Percentile --      Boys Diastolic BP Percentile --      Pulse Rate 01/24/24 1609 (!) 111     Resp 01/24/24 1609 20     Temp 01/24/24 1609 99.8 F (37.7 C)     Temp Source 01/24/24 1609 Oral     SpO2 01/24/24 1609 92 %     Weight --      Height 01/24/24 1609 6' (1.829 m)     Head Circumference --      Peak Flow --      Pain Score 01/24/24 1608 8     Pain Loc --      Pain Education --      Exclude from Growth Chart --    No data found.  Updated Vital Signs BP 137/75 (BP Location: Right Arm)   Pulse (!) 111   Temp 99.8 F (37.7 C) (Oral)   Resp 20   Ht 6' (1.829 m)   SpO2 92%   BMI 25.77 kg/m   Visual Acuity Right Eye Distance:   Left Eye  Distance:   Bilateral Distance:    Right Eye Near:   Left Eye Near:    Bilateral Near:     Physical Exam Vitals and nursing note reviewed.  Constitutional:      General: He is not in acute distress.    Appearance: He is well-developed.  HENT:     Head: Normocephalic and atraumatic.  Eyes:     Conjunctiva/sclera: Conjunctivae normal.  Cardiovascular:     Rate and Rhythm: Normal rate and regular rhythm.     Heart sounds: No murmur heard. Pulmonary:     Effort: Pulmonary effort is normal. No respiratory distress.     Breath sounds: Wheezing and rales present.     Comments: Rales heard to the left mid and lower side.  Right side unremarkable.  Some small amount of wheezing heard diffusely. Abdominal:     Palpations: Abdomen is soft.     Tenderness: There is no abdominal tenderness.  Musculoskeletal:        General: No swelling.     Cervical back: Neck supple.  Skin:    General: Skin is warm and dry.     Capillary Refill: Capillary refill takes less than 2 seconds.  Neurological:     Mental Status: He is alert.  Psychiatric:        Mood and Affect: Mood normal.      UC Treatments / Results  Labs (all labs ordered are listed, but only abnormal results are displayed) Labs Reviewed  POC COVID19/FLU A&B COMBO    EKG   Radiology No results found.  Procedures Procedures (including critical care time)  Medications Ordered in UC Medications  albuterol (PROVENTIL) (2.5 MG/3ML) 0.083% nebulizer solution 2.5 mg (has no administration in time range)    Initial Impression / Assessment and Plan / UC Course  I have reviewed the triage vital signs and the nursing notes.  Pertinent labs & imaging results that were available during my care of the patient were reviewed by me and considered in my medical decision making (see chart for details).     This patient presents to the ED for concern of cough, body aches.  Differential diagnosis includes COVID-19, pneumonia, viral  URI, influenza    Additional history obtained:  Additional history obtained from chart review   Lab Tests:  I Ordered,  and personally interpreted labs.  The pertinent results include: COVID-19/influenza A/B negative   Imaging Studies ordered:  I ordered imaging studies including chest x-ray I independently visualized and interpreted imaging which showed no acute findings I agree with the radiologist interpretation   Medicines ordered and prescription drug management:  I ordered medication including albuterol for wheezing Reevaluation of the patient after these medicines showed that the patient improved I have reviewed the patients home medicines and have made adjustments as needed   Problem List /UC course:  Patient with past history significant for asthma presents to urgent care today with concerns of cough and bodyaches.  Endorses has been ongoing for the last 3 days.  States that he has been diagnosed with seasonal asthma and therefore does not have any inhalers at home.  Has been taking Robitussin and ibuprofen  with minimal improvement in symptoms.  Endorses pain with deep and elation often to the left anterior posterior chest.  No sick contacts as far as he can recall.  States that he had pneumonia last year and this feels somewhat similar. Physical exam reveals rales to left mid and lower lung on auscultation.  Right side unremarkable with exception of the diffuse wheezing heard all throughout. COVID-19, influenza A/B rapid antigen swab is negative.  Chest x-ray negative.  Primary concern at this time is a possible bronchitis versus developing pneumonia.  Will start patient on a course of steroids, antibiotics, inhaler, and cough medications.  Advised patient to return the urgent care for concerns of worsening symptoms or go to the emergency department he feels that he is having significant trouble breathing.  He is otherwise stable this time for outpatient follow-up and  discharged home.   Social Determinants of Health:  None  Final Clinical Impressions(s) / UC Diagnoses   Final diagnoses:  Acute cough   Discharge Instructions   None    ED Prescriptions   None    PDMP not reviewed this encounter.   Toluwani Yadav A, PA-C 01/24/24 1807

## 2024-01-24 NOTE — Discharge Instructions (Addendum)
 You were seen at urgent care today for concerns of bodyaches and a cough.  You tested negative for COVID-19, influenza A, influenza B, and your chest x-ray appears to be clear.  My concern is that with your lungs and how they currently sound, you may be developing pneumonia or this may be possibly bronchitis.  I am starting you on a course of medications to try to help address this.  Please take this as prescribed.  For concerns of worsening symptoms, return to urgent care or go to the emergency department.  Otherwise, please follow-up with your primary care provider.
